# Patient Record
Sex: Female | Born: 1960 | Race: White | Hispanic: No | Marital: Married | Smoking: Never smoker
Health system: Southern US, Community
[De-identification: ages and names within clinical notes are randomized; demographics above are authoritative.]

## PROBLEM LIST (undated history)

## (undated) DIAGNOSIS — M810 Age-related osteoporosis without current pathological fracture: Secondary | ICD-10-CM

## (undated) HISTORY — DX: Age-related osteoporosis without current pathological fracture: M81.0

## (undated) HISTORY — PX: TONSILLECTOMY AND ADENOIDECTOMY: SHX28

## (undated) HISTORY — PX: BREAST BIOPSY: SHX20

## (undated) HISTORY — PX: TONSILLECTOMY: SUR1361

---

## 2005-06-08 ENCOUNTER — Ambulatory Visit: Payer: Self-pay | Admitting: Obstetrics and Gynecology

## 2005-06-21 ENCOUNTER — Ambulatory Visit: Payer: Self-pay | Admitting: Obstetrics and Gynecology

## 2007-02-02 IMAGING — US ULTRASOUND LEFT BREAST
1 series · 17 of 24 positions shown · non-contrast
Comparison: none

REASON FOR EXAM: Nodularity
                       U/S PRN
COMMENTS:

[Series 1: ultrasound left breast · 17 of 24 slices shown]
[im 1/24]
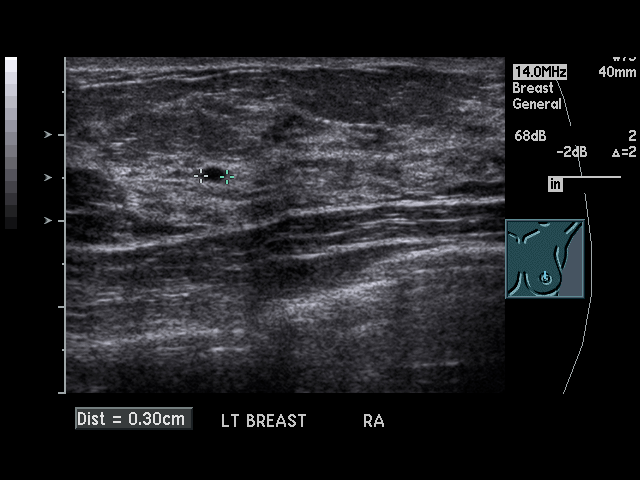
[im 3/24]
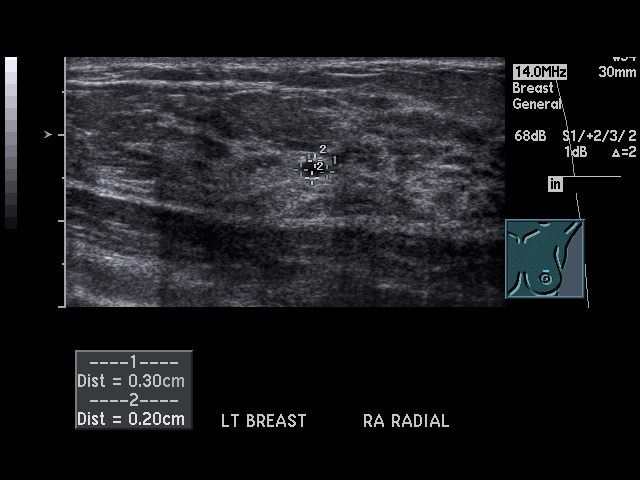
[im 4/24]
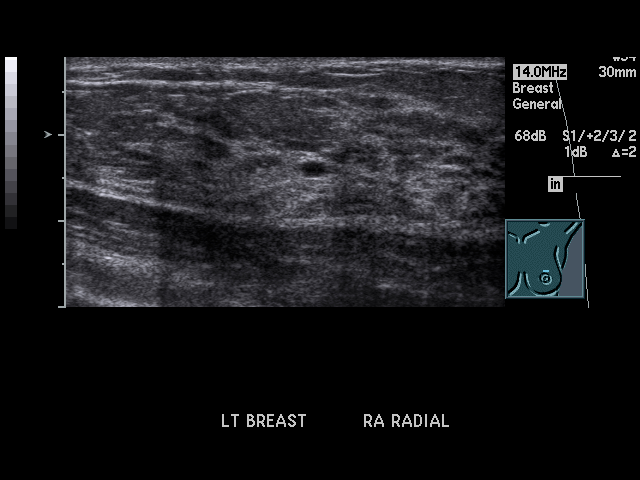
[im 5/24]
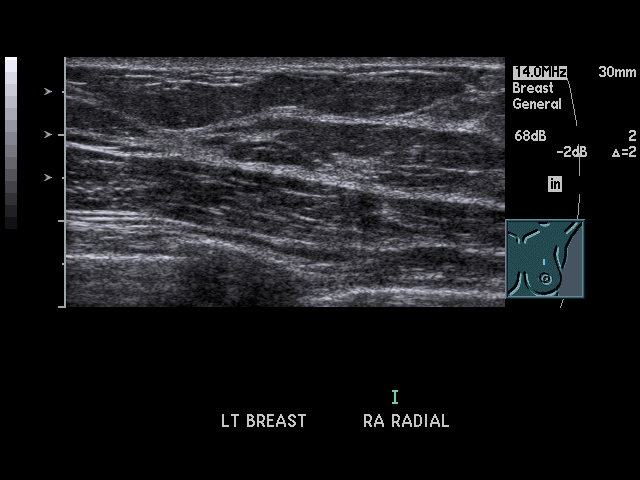
[im 7/24]
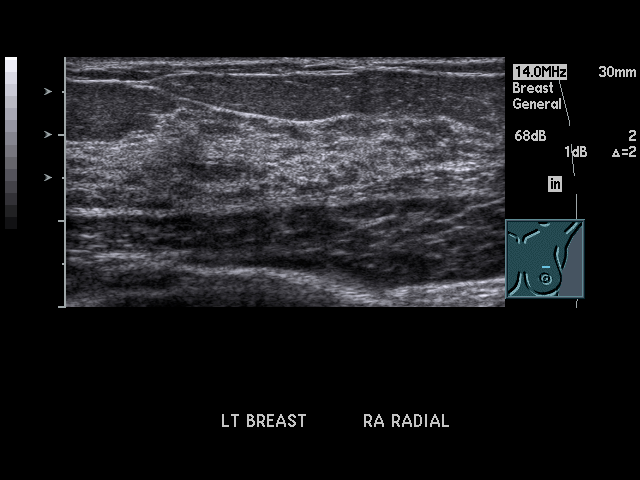
[im 8/24]
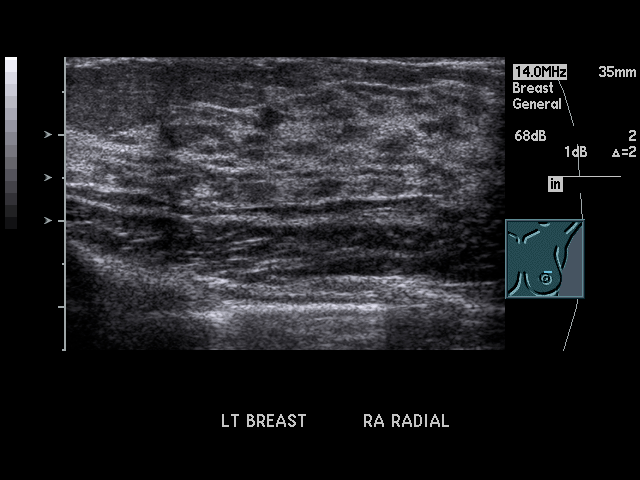
[im 10/24]
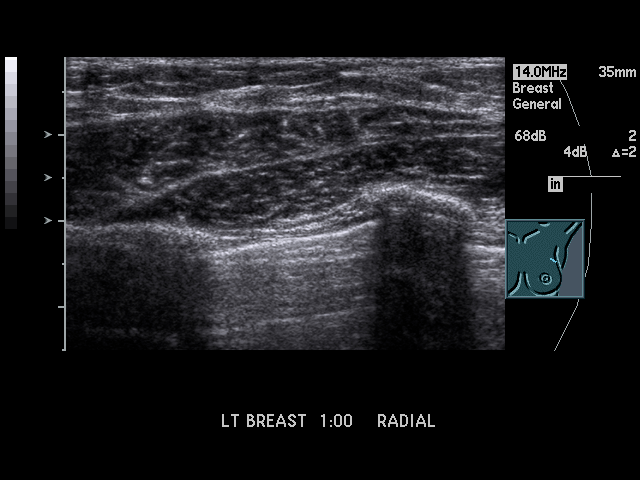
[im 11/24]
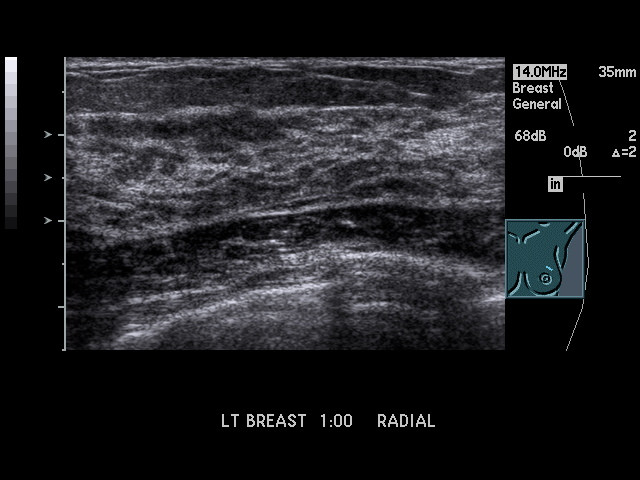
[im 13/24]
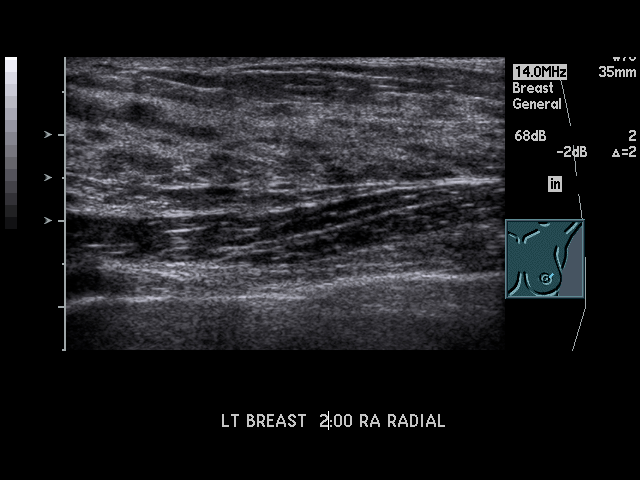
[im 14/24]
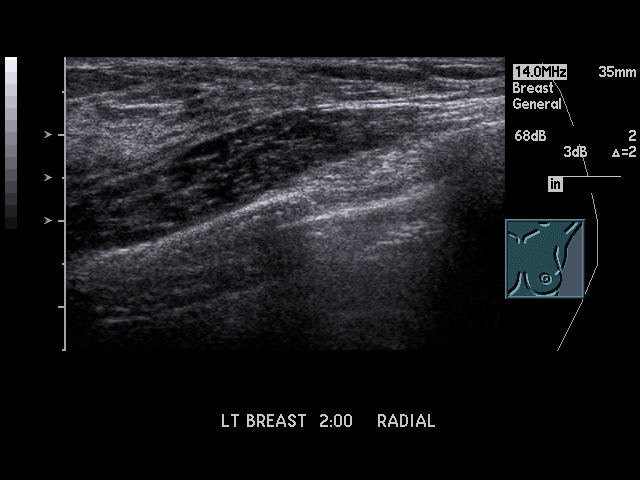
[im 15/24]
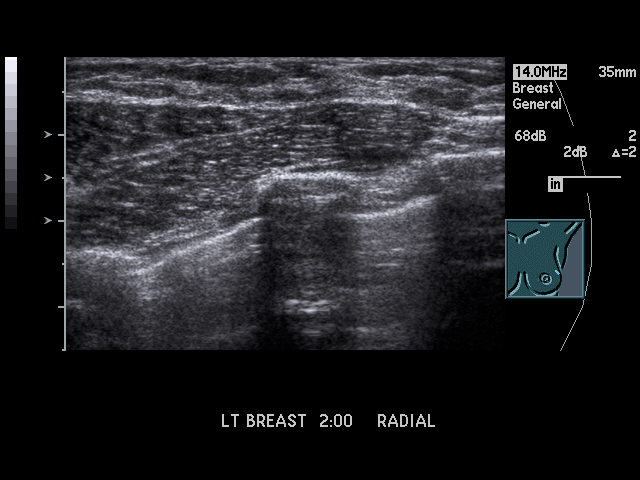
[im 17/24]
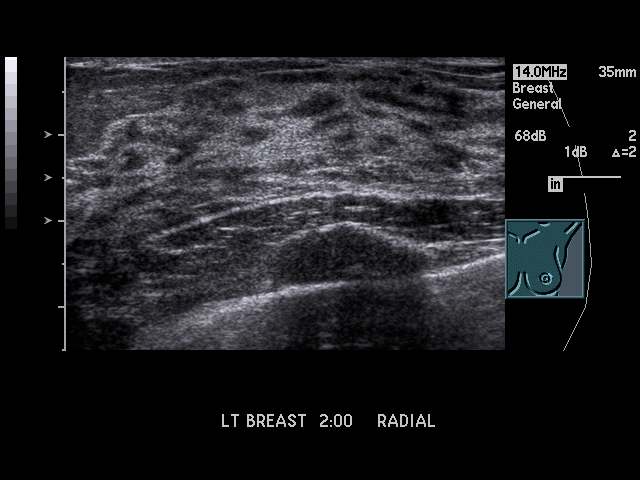
[im 18/24]
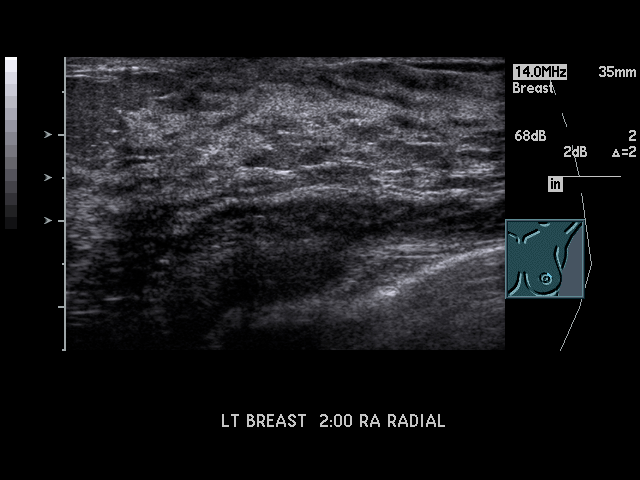
[im 20/24]
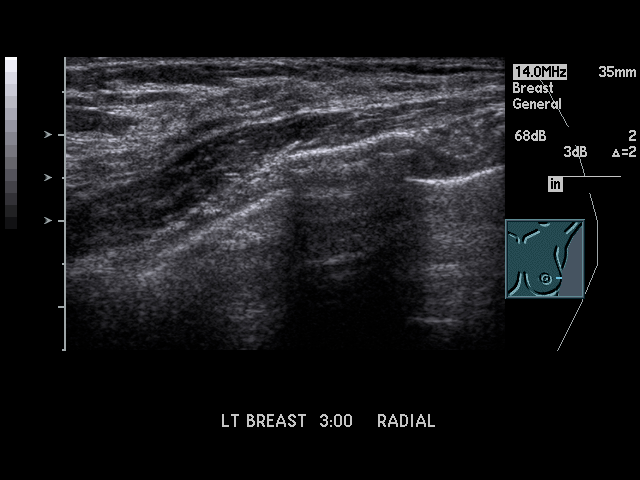
[im 21/24]
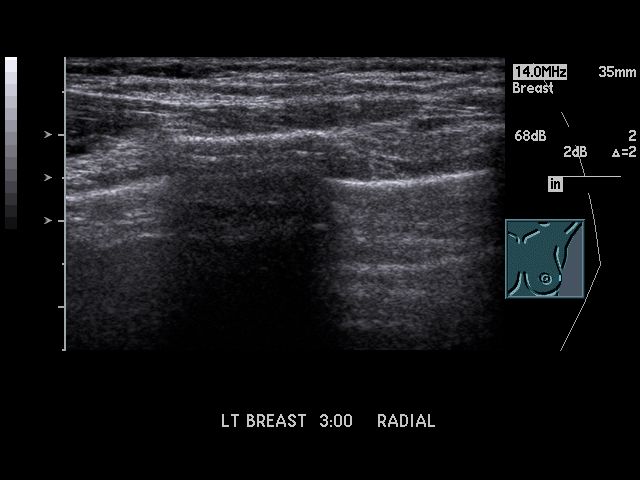
[im 22/24]
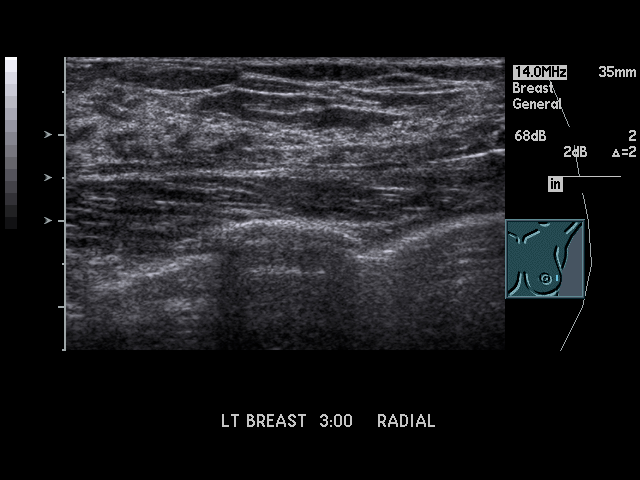
[im 24/24]
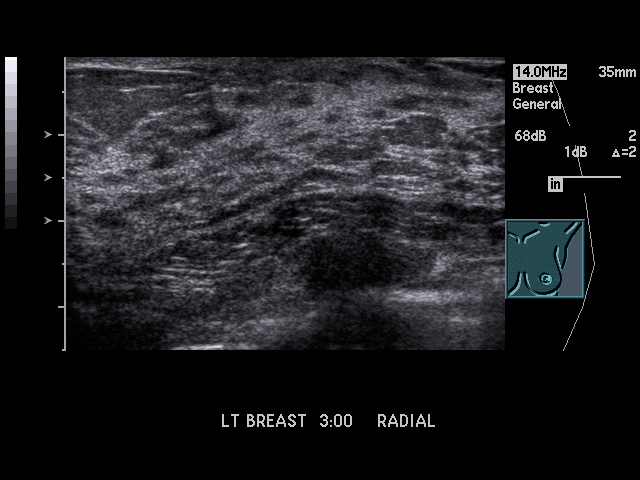

[17 of 24 positions shown; findings below may reference images not displayed]

PROCEDURE:     US  - US BREAST LEFT  - June 21, 2005  [DATE]

RESULT:     Ultrasound is performed of the upper outer periareolar region on
the LEFT.

Ultrasound today did not reveal a discrete cystic or solid mass in the area
of interest. A tiny, 2.0 to 3.0 mm in diameter, anechoic structure likely
reflecting a cyst was seen at the 12 o'clock position.
IMPRESSION: I do not see findings to suggest malignancy. Please see the
dictation for the supplemental mammographic views of this same day for final
recommendations and BI-RADS classification.

## 2012-03-22 ENCOUNTER — Ambulatory Visit (INDEPENDENT_AMBULATORY_CARE_PROVIDER_SITE_OTHER): Payer: BC Managed Care – PPO | Admitting: Internal Medicine

## 2012-03-22 ENCOUNTER — Encounter: Payer: Self-pay | Admitting: Internal Medicine

## 2012-03-22 ENCOUNTER — Other Ambulatory Visit (HOSPITAL_COMMUNITY)
Admission: RE | Admit: 2012-03-22 | Discharge: 2012-03-22 | Disposition: A | Discharge status: Home / Self Care | Payer: BC Managed Care – PPO | Source: Ambulatory Visit | Attending: Urology | Admitting: Urology

## 2012-03-22 VITALS — BP 140/70 | HR 86 | Temp 98.0°F | Ht 64.0 in | Wt 116.8 lb

## 2012-03-22 DIAGNOSIS — R5381 Other malaise: Secondary | ICD-10-CM

## 2012-03-22 DIAGNOSIS — Z01419 Encounter for gynecological examination (general) (routine) without abnormal findings: Secondary | ICD-10-CM | POA: Insufficient documentation

## 2012-03-22 DIAGNOSIS — Z139 Encounter for screening, unspecified: Secondary | ICD-10-CM

## 2012-03-22 DIAGNOSIS — R5383 Other fatigue: Secondary | ICD-10-CM

## 2012-03-22 MED ORDER — IBUPROFEN 800 MG PO TABS: 800.0000 mg | ORAL_TABLET | Freq: Two times a day (BID) | ORAL | Status: DC | PRN

## 2012-03-22 MED ORDER — VALACYCLOVIR HCL 500 MG PO TABS: 500.0000 mg | ORAL_TABLET | Freq: Every day | ORAL | Status: DC

## 2012-03-22 NOTE — Patient Instructions (Signed)
It was nice meeting you today.  We are going to get you scheduled for labs and your yearly mammogram.  Let me know if you have any problems.

## 2012-03-24 ENCOUNTER — Encounter: Payer: Self-pay | Admitting: Internal Medicine

## 2012-03-24 NOTE — Progress Notes (Signed)
  Subjective:    Patient ID: Whitney Guerrero, female    DOB: [birth year], 51 y.o.   MRN: [id number]  HPI 51 year old female who comes in today to establish care and for her complete physical exam.  Previous patient of Dr Lin Givens.  Overall doing well.  Some increased stress.  Son is in [place].  She has custody over her four year old granddaughter.  Works on a farm.  Has had some recent problems with low back discomfort.  Present now for a few weeks.  Does a lot of heavy lifting, etc.  Is getting better.  Takes an occasional Ibuprofen.  No radiation of the pain.  No numbness or tingling.  Pain localized to the lower back.  Changing and now wearing running shoes has helped.  Stays active.  No chest pain or tightness with increased activity or exertion.  Feels she is handling stress relatively well.   History reviewed. No pertinent past medical history.   Review of Systems Patient denies any headache, lightheadedness or dizziness.  No significant sinus or allergy symptoms.  No chest pain, tightness or palpitations.  No increased shortness of breath, cough or congestion.  No acid reflux, dysphagia or odynophagia.  No nausea or vomiting.  No abdominal pain or cramping.  No bowel change, such as diarrhea, constipation, BRBPR or melana.  No urine change.  No period for six months.        Objective:   Physical Exam Filed Vitals:   03/22/12 0945  BP: 140/70  Pulse: 86  Temp: 98 F (36.7 C)   Blood pressure recheck:  37/6  51 year old female in no acute distress.   HEENT:  Nares- clear.  Oropharynx - without lesions. NECK:  Supple.  Nontender.  No audible bruit.  HEART:  Appears to be regular. LUNGS:  No crackles or wheezing audible.  Respirations even and unlabored.  RADIAL PULSE:  Equal bilaterally.    BREASTS:  No nipple discharge or nipple retraction present.  Could not appreciate any distinct nodules or axillary adenopathy.  ABDOMEN:  Soft, nontender.  Bowel sounds present and normal.  No  audible abdominal bruit.  GU:  Normal external genitalia.  Vaginal vault without lesions.  Cervix identified.  Pap performed. Could not appreciate any adnexal masses or tenderness.   RECTAL:  Heme negative.   EXTREMITIES:  No increased edema present.  DP pulses palpable and equal bilaterally. MSK:  No pain to palpation.  Negative straight leg raise.  Motor strength equal bilaterally.  Able to sit, stand and walk without difficulty.          Assessment & Plan:  REOCCURRING FEVER BLISTERS.  Takes Valtrex regularly.  Rx refilled.   BACK PAIN.  Exam as outlined.  Improving with changing shoes.  Discussed other conservative measures.  No straining with lifting, etc.  Hold on xray.  Follow.  If persistent problems - will require reevaluation.    FATIGUE.  Did report some fatigue.  Overall feels she is doing well.  Will check cbc, met c and tsh.  HEALTH MAINTENANCE.  Physical today.  Schedule her mammogram.  Pap with HPV today.  Check fasting lipid profile.  Discussed the need for a colonscopy.  She will call me when agreeable for colonoscopy.  Hemoccult cards.

## 2012-03-29 ENCOUNTER — Other Ambulatory Visit (INDEPENDENT_AMBULATORY_CARE_PROVIDER_SITE_OTHER): Payer: BC Managed Care – PPO

## 2012-03-29 DIAGNOSIS — Z139 Encounter for screening, unspecified: Secondary | ICD-10-CM

## 2012-03-29 DIAGNOSIS — R5383 Other fatigue: Secondary | ICD-10-CM

## 2012-03-29 LAB — CBC WITH DIFFERENTIAL/PLATELET
Basophils Absolute: 0 10*3/uL (ref 0.0–0.1)
Basophils Relative: 0.4 % (ref 0.0–3.0)
Eosinophils Absolute: 0.1 10*3/uL (ref 0.0–0.7)
Lymphocytes Relative: 32 % (ref 12.0–46.0)
MCHC: 33.3 g/dL (ref 30.0–36.0)
Neutrophils Relative %: 53.6 % (ref 43.0–77.0)
RBC: 4.22 Mil/uL (ref 3.87–5.11)
RDW: 12.9 % (ref 11.5–14.6)

## 2012-03-29 LAB — LIPID PANEL: Total CHOL/HDL Ratio: 3

## 2012-03-29 LAB — COMPREHENSIVE METABOLIC PANEL
ALT: 20 U/L (ref 0–35)
AST: 25 U/L (ref 0–37)
Albumin: 3.9 g/dL (ref 3.5–5.2)
Calcium: 9.3 mg/dL (ref 8.4–10.5)
Chloride: 103 mEq/L (ref 96–112)
Potassium: 4 mEq/L (ref 3.5–5.1)
Total Protein: 7.1 g/dL (ref 6.0–8.3)

## 2012-03-29 LAB — LDL CHOLESTEROL, DIRECT: Direct LDL: 117.6 mg/dL

## 2012-03-30 ENCOUNTER — Other Ambulatory Visit: Payer: Self-pay | Admitting: Internal Medicine

## 2012-03-30 DIAGNOSIS — D72819 Decreased white blood cell count, unspecified: Secondary | ICD-10-CM

## 2012-04-02 ENCOUNTER — Telehealth: Payer: Self-pay | Admitting: Internal Medicine

## 2012-04-02 ENCOUNTER — Encounter: Payer: Self-pay | Admitting: *Deleted

## 2012-04-02 NOTE — Telephone Encounter (Signed)
Please notify pt mammogram (03/29/12) - ok.

## 2012-04-02 NOTE — Progress Notes (Signed)
Result letter mailed out to patient home address

## 2012-04-05 ENCOUNTER — Encounter: Payer: Self-pay | Admitting: Internal Medicine

## 2012-04-10 NOTE — Telephone Encounter (Signed)
Called patient no answer.

## 2012-05-03 ENCOUNTER — Other Ambulatory Visit: Payer: Self-pay | Admitting: Internal Medicine

## 2012-05-03 ENCOUNTER — Other Ambulatory Visit (INDEPENDENT_AMBULATORY_CARE_PROVIDER_SITE_OTHER): Payer: BC Managed Care – PPO

## 2012-05-03 DIAGNOSIS — D72819 Decreased white blood cell count, unspecified: Secondary | ICD-10-CM

## 2012-05-03 LAB — CBC WITH DIFFERENTIAL/PLATELET
Basophils Absolute: 0 10*3/uL (ref 0.0–0.1)
Basophils Relative: 0.3 % (ref 0.0–3.0)
Hemoglobin: 13.5 g/dL (ref 12.0–15.0)
Lymphocytes Relative: 30.2 % (ref 12.0–46.0)
Monocytes Relative: 10.9 % (ref 3.0–12.0)
Neutro Abs: 2.4 10*3/uL (ref 1.4–7.7)
RBC: 4.29 Mil/uL (ref 3.87–5.11)
RDW: 12.7 % (ref 11.5–14.6)

## 2012-05-09 ENCOUNTER — Telehealth: Payer: Self-pay | Admitting: *Deleted

## 2012-05-09 NOTE — Telephone Encounter (Signed)
Message copied by Marlene Lard on Thu May 09, 2012  5:46 PM ------      Message from: Charm Barges      Created: Fri May 03, 2012  1:14 PM       Cbc ok.  White blood cell count improved.  Will follow.  Would like to recheck cbc in 5/14 just to confirm stable.   I will order - please schedule appt.

## 2012-05-09 NOTE — Telephone Encounter (Signed)
Left message for patient to return call.

## 2012-05-09 NOTE — Telephone Encounter (Signed)
Message copied by Marlene Lard on Thu May 09, 2012  4:52 PM ------      Message from: Charm Barges      Created: Fri May 03, 2012  1:14 PM       Cbc ok.  White blood cell count improved.  Will follow.  Would like to recheck cbc in 5/14 just to confirm stable.   I will order - please schedule appt.

## 2012-05-17 NOTE — Telephone Encounter (Signed)
Sent patient a letter about her next lab appointment.

## 2012-08-21 ENCOUNTER — Other Ambulatory Visit: Payer: Self-pay | Admitting: *Deleted

## 2012-08-27 MED ORDER — IBUPROFEN 800 MG PO TABS: 800.0000 mg | ORAL_TABLET | Freq: Two times a day (BID) | ORAL | Status: DC | PRN

## 2012-09-16 ENCOUNTER — Telehealth: Payer: Self-pay | Admitting: Internal Medicine

## 2012-09-16 DIAGNOSIS — Z1211 Encounter for screening for malignant neoplasm of colon: Secondary | ICD-10-CM

## 2012-09-16 NOTE — Telephone Encounter (Signed)
Patient needing a referral to GI for her 1st Colonoscopy.

## 2012-09-16 NOTE — Telephone Encounter (Signed)
Left message on patient voicemail letting her know that the order has been placed for her referral for colonoscopy, and that Amber should be calling her with an appt.

## 2012-09-16 NOTE — Telephone Encounter (Signed)
Please Advise..... Patient wants a referral to GI for her 1st Colonoscopy

## 2012-09-16 NOTE — Telephone Encounter (Signed)
Order placed for referral for GI - colonoscopy.  Whitney Guerrero should be calling her with an appt.

## 2012-10-08 ENCOUNTER — Other Ambulatory Visit: Payer: BC Managed Care – PPO

## 2012-10-11 ENCOUNTER — Other Ambulatory Visit (INDEPENDENT_AMBULATORY_CARE_PROVIDER_SITE_OTHER): Payer: BC Managed Care – PPO

## 2012-10-11 DIAGNOSIS — D72819 Decreased white blood cell count, unspecified: Secondary | ICD-10-CM

## 2012-10-11 LAB — CBC WITH DIFFERENTIAL/PLATELET
Basophils Absolute: 0 10*3/uL (ref 0.0–0.1)
HCT: 39.6 % (ref 36.0–46.0)
Hemoglobin: 13.4 g/dL (ref 12.0–15.0)
Lymphs Abs: 1.5 10*3/uL (ref 0.7–4.0)
MCV: 93.6 fl (ref 78.0–100.0)
Monocytes Absolute: 0.6 10*3/uL (ref 0.1–1.0)
Neutro Abs: 2.5 10*3/uL (ref 1.4–7.7)
Platelets: 370 10*3/uL (ref 150.0–400.0)
RDW: 12.2 % (ref 11.5–14.6)

## 2013-01-29 ENCOUNTER — Telehealth: Payer: Self-pay | Admitting: Internal Medicine

## 2013-01-29 NOTE — Telephone Encounter (Signed)
Patient has a sinus infection and wants something called into the pharmacy

## 2013-01-29 NOTE — Telephone Encounter (Signed)
Scheduled appt with Whitney Guerrero tomorrow

## 2013-01-30 ENCOUNTER — Ambulatory Visit: Payer: BC Managed Care – PPO | Admitting: Adult Health

## 2013-03-03 ENCOUNTER — Other Ambulatory Visit: Payer: Self-pay | Admitting: *Deleted

## 2013-03-03 MED ORDER — VALACYCLOVIR HCL 500 MG PO TABS: 500.0000 mg | ORAL_TABLET | Freq: Every day | ORAL | Status: DC

## 2013-03-24 ENCOUNTER — Ambulatory Visit (INDEPENDENT_AMBULATORY_CARE_PROVIDER_SITE_OTHER): Payer: BC Managed Care – PPO | Admitting: Internal Medicine

## 2013-03-24 ENCOUNTER — Encounter: Payer: Self-pay | Admitting: Internal Medicine

## 2013-03-24 ENCOUNTER — Other Ambulatory Visit (HOSPITAL_COMMUNITY)
Admission: RE | Admit: 2013-03-24 | Discharge: 2013-03-24 | Disposition: A | Discharge status: Home / Self Care | Payer: BC Managed Care – PPO | Source: Ambulatory Visit | Attending: Internal Medicine | Admitting: Internal Medicine

## 2013-03-24 ENCOUNTER — Encounter: Payer: BC Managed Care – PPO | Admitting: Internal Medicine

## 2013-03-24 ENCOUNTER — Encounter (INDEPENDENT_AMBULATORY_CARE_PROVIDER_SITE_OTHER): Payer: Self-pay

## 2013-03-24 VITALS — BP 110/80 | HR 85 | Temp 98.2°F | Ht 64.0 in | Wt 115.5 lb

## 2013-03-24 DIAGNOSIS — Z13220 Encounter for screening for lipoid disorders: Secondary | ICD-10-CM

## 2013-03-24 DIAGNOSIS — Z23 Encounter for immunization: Secondary | ICD-10-CM

## 2013-03-24 DIAGNOSIS — Z01419 Encounter for gynecological examination (general) (routine) without abnormal findings: Secondary | ICD-10-CM | POA: Insufficient documentation

## 2013-03-24 DIAGNOSIS — Z124 Encounter for screening for malignant neoplasm of cervix: Secondary | ICD-10-CM

## 2013-03-24 DIAGNOSIS — R5381 Other malaise: Secondary | ICD-10-CM

## 2013-03-24 DIAGNOSIS — R5383 Other fatigue: Secondary | ICD-10-CM

## 2013-03-24 DIAGNOSIS — Z1151 Encounter for screening for human papillomavirus (HPV): Secondary | ICD-10-CM | POA: Insufficient documentation

## 2013-03-26 ENCOUNTER — Encounter: Payer: Self-pay | Admitting: Internal Medicine

## 2013-03-26 ENCOUNTER — Encounter: Payer: Self-pay | Admitting: *Deleted

## 2013-03-26 NOTE — Progress Notes (Signed)
  Subjective:    Patient ID: Whitney Guerrero, female    DOB: 10-24-1960, 52 y.o.   MRN: [id number]  HPI 52 year old female who comes in today for her complete physical exam.  Overall doing well.  Some increased stress.  She has custody over her 88 year old granddaughter.  Works on a farm.  Has had some problems with low back discomfort.  This has been better recently.  Not a significant issue for her now. Stays active.  No chest pain or tightness with increased activity or exertion.  Feels she is handling stress relatively well.  No period in one year.  Eating and drinking well.  Bowels stable.     Review of Systems Patient denies any headache, lightheadedness or dizziness.  No significant sinus or allergy symptoms.  No chest pain, tightness or palpitations.  No increased shortness of breath, cough or congestion.  No acid reflux, dysphagia or odynophagia.  No nausea or vomiting.  No abdominal pain or cramping.  No bowel change, such as diarrhea, constipation, BRBPR or melana.  No urine change.  No period for one year.  Back better.  Handling stress relatively well.     Objective:   Physical Exam  Filed Vitals:   03/24/13 1048  BP: 110/80  Pulse: 85  Temp: 98.2 F (46.59 C)   52 year old female in no acute distress.   HEENT:  Nares- clear.  Oropharynx - without lesions. NECK:  Supple.  Nontender.  No audible bruit.  HEART:  Appears to be regular. LUNGS:  No crackles or wheezing audible.  Respirations even and unlabored.  RADIAL PULSE:  Equal bilaterally.    BREASTS:  No nipple discharge or nipple retraction present.  Could not appreciate any distinct nodules or axillary adenopathy.  ABDOMEN:  Soft, nontender.  Bowel sounds present and normal.  No audible abdominal bruit.  GU:  Normal external genitalia.  Vaginal vault without lesions.  Cervix identified.  Pap performed. Could not appreciate any adnexal masses or tenderness.   RECTAL:  Heme negative.   EXTREMITIES:  No increased edema  present.  DP pulses palpable and equal bilaterally.          Assessment & Plan:  REOCCURRING FEVER BLISTERS.  Takes Valtrex regularly.   BACK PAIN.  Not an issue for her now.  Follow.   FATIGUE.  Did report some fatigue.  Overall feels she is doing well.  Will check met c and tsh.  Recent cbc (5/14) within normal limits.    HEALTH MAINTENANCE.  Physical today.  Schedule her mammogram.  Pap with HPV today.  Check fasting lipid profile.  Just had colonoscopy.  States everything checked out fine and they recommended f/u colonoscopy in 10 years.    I spent 25 minutes with the patient and more than 50% of the time was spent in consultation regarding the above.

## 2013-04-04 ENCOUNTER — Other Ambulatory Visit (INDEPENDENT_AMBULATORY_CARE_PROVIDER_SITE_OTHER): Payer: BC Managed Care – PPO

## 2013-04-04 DIAGNOSIS — R5381 Other malaise: Secondary | ICD-10-CM

## 2013-04-04 DIAGNOSIS — R5383 Other fatigue: Secondary | ICD-10-CM

## 2013-04-04 DIAGNOSIS — Z13220 Encounter for screening for lipoid disorders: Secondary | ICD-10-CM

## 2013-04-04 LAB — COMPREHENSIVE METABOLIC PANEL
ALT: 21 U/L (ref 0–35)
Alkaline Phosphatase: 79 U/L (ref 39–117)
CO2: 33 mEq/L — ABNORMAL HIGH (ref 19–32)
Creatinine, Ser: 0.8 mg/dL (ref 0.4–1.2)
GFR: 75.72 mL/min (ref >60.00)
Glucose, Bld: 83 mg/dL (ref 70–99)
Sodium: 137 mEq/L (ref 135–145)
Total Bilirubin: 0.6 mg/dL (ref 0.3–1.2)
Total Protein: 6.7 g/dL (ref 6.0–8.3)

## 2013-04-04 LAB — LIPID PANEL
HDL: 69 mg/dL (ref >39.00)
Triglycerides: 69 mg/dL (ref 0.0–149.0)
VLDL: 13.8 mg/dL (ref 0.0–40.0)

## 2013-04-04 LAB — LDL CHOLESTEROL, DIRECT: Direct LDL: 129.2 mg/dL

## 2013-04-04 LAB — TSH: TSH: 2.73 u[IU]/mL (ref 0.35–5.50)

## 2013-04-07 ENCOUNTER — Encounter: Payer: Self-pay | Admitting: *Deleted

## 2013-04-16 ENCOUNTER — Encounter: Payer: Self-pay | Admitting: Internal Medicine

## 2013-09-18 ENCOUNTER — Telehealth: Payer: Self-pay | Admitting: *Deleted

## 2013-09-18 MED ORDER — IBUPROFEN 800 MG PO TABS: 800.0000 mg | ORAL_TABLET | Freq: Two times a day (BID) | ORAL | Status: DC | PRN

## 2013-09-18 MED ORDER — VALACYCLOVIR HCL 500 MG PO TABS: 500.0000 mg | ORAL_TABLET | Freq: Every day | ORAL | Status: DC

## 2013-09-18 NOTE — Telephone Encounter (Signed)
Pt called stating that her refills were never sent in at her last visit. Refills sent while pt was on the phone today

## 2013-11-25 ENCOUNTER — Ambulatory Visit (INDEPENDENT_AMBULATORY_CARE_PROVIDER_SITE_OTHER): Payer: BC Managed Care – PPO | Admitting: Adult Health

## 2013-11-25 ENCOUNTER — Encounter: Payer: Self-pay | Admitting: Adult Health

## 2013-11-25 VITALS — BP 102/64 | HR 84 | Temp 98.0°F | Resp 14 | Ht 64.0 in | Wt 113.5 lb

## 2013-11-25 DIAGNOSIS — R3129 Other microscopic hematuria: Secondary | ICD-10-CM | POA: Insufficient documentation

## 2013-11-25 DIAGNOSIS — N390 Urinary tract infection, site not specified: Secondary | ICD-10-CM

## 2013-11-25 DIAGNOSIS — F439 Reaction to severe stress, unspecified: Secondary | ICD-10-CM | POA: Insufficient documentation

## 2013-11-25 LAB — POCT URINALYSIS DIPSTICK
Bilirubin, UA: NEGATIVE
Glucose, UA: NEGATIVE
Ketones, UA: NEGATIVE
Nitrite, UA: NEGATIVE
Protein, UA: 30
Spec Grav, UA: 1.015
UROBILINOGEN UA: 0.2
pH, UA: 8.5

## 2013-11-25 LAB — URINALYSIS, ROUTINE W REFLEX MICROSCOPIC
Ketones, ur: NEGATIVE
Nitrite: NEGATIVE
Specific Gravity, Urine: 1.015 (ref 1.000–1.030)
URINE GLUCOSE: NEGATIVE
Urobilinogen, UA: 0.2 (ref 0.0–1.0)
pH: 8.5 — AB (ref 5.0–8.0)

## 2013-11-25 MED ORDER — CIPROFLOXACIN HCL 250 MG PO TABS: 250.0000 mg | ORAL_TABLET | Freq: Two times a day (BID) | ORAL | Status: DC

## 2013-11-25 NOTE — Progress Notes (Signed)
Pre visit review using our clinic review tool, if applicable. No additional management support is needed unless otherwise documented below in the visit note. 

## 2013-11-25 NOTE — Progress Notes (Signed)
   Subjective:    Patient ID: Whitney Guerrero, female    DOB: 1960-09-23, 53 y.o.   MRN: [id number]  Urinary Tract Infection  Associated symptoms include frequency.    Pleasant 53 yo female presents today for symptoms of a UTI. Started last night around [phone]. Describes dysuria and frequent urination that has kept her up. Usually drinks water and urinates a lot, however last night was excessive. Denies costoverterbral pain, states she did feel warm this morning, but did not have a fever.   No past medical history on file.  Current Outpatient Prescriptions on File Prior to Visit  Medication Sig Dispense Refill  . Calcium Carbonate-Vit D-Min 1200-1000 MG-UNIT CHEW Chew 1,200 mg by mouth daily.      Marland Kitchen. ibuprofen (ADVIL,MOTRIN) 800 MG tablet Take 1 tablet (800 mg total) by mouth 2 (two) times daily as needed.  30 tablet  1  . Omega-3 Fatty Acids (FISH OIL) 1000 MG CAPS Take 1,000 mg by mouth daily.      . valACYclovir (VALTREX) 500 MG tablet Take 1 tablet (500 mg total) by mouth daily.  30 tablet  6   No current facility-administered medications on file prior to visit.     Review of Systems  Constitutional: Negative.   Genitourinary: Positive for dysuria and frequency.  All other systems reviewed and are negative.      Objective:  BP 102/64  Pulse 84  Temp(Src) 98 F (36.7 C) (Oral)  Resp 14  Wt 113 lb 8 oz (51.483 kg)  SpO2 97%   Physical Exam  Constitutional: She is oriented to person, place, and time. She appears well-developed and well-nourished. No distress.  Cardiovascular: Normal rate and regular rhythm.   Pulmonary/Chest: Effort normal. No respiratory distress.  Musculoskeletal: Normal range of motion.  Neurological: She is alert and oriented to person, place, and time.  Skin: Skin is warm and dry.  Psychiatric: She has a normal mood and affect. Her behavior is normal. Judgment and thought content normal.      Assessment & Plan:   1. Urinary tract infection, site  not specified UA consistent with UTI. Will send for culture and urinalysis. Start cipro.  - POCT urinalysis dipstick - Urine culture - ciprofloxacin (CIPRO) 250 MG tablet; Take 1 tablet (250 mg total) by mouth 2 (two) times daily.  Dispense: 6 tablet; Refill: 0 - Urinalysis, Routine w reflex microscopic  2. Microscopic hematuria Return 3 days after completing antibiotic to verify that blood has cleared. - Urinalysis, Routine w reflex microscopic; Future

## 2013-11-25 NOTE — Patient Instructions (Signed)
  Start Cipro 250 mg twice a day for 3 days.  Drink plenty of water.  Try AZO or Urostat which are sold over-the-counter and used for a urinary tract infection discomfort. Take as directed on the package.  Please return for followup urinalysis 3 days after completing your antibiotics to verify that blood has cleared.  Call with any questions or concerns. Give may also take Tylenol for general discomfort as needed.

## 2013-11-27 LAB — URINE CULTURE

## 2013-12-01 ENCOUNTER — Other Ambulatory Visit: Payer: BC Managed Care – PPO

## 2013-12-05 ENCOUNTER — Other Ambulatory Visit: Payer: Self-pay | Admitting: Adult Health

## 2013-12-05 ENCOUNTER — Other Ambulatory Visit (INDEPENDENT_AMBULATORY_CARE_PROVIDER_SITE_OTHER): Payer: BC Managed Care – PPO

## 2013-12-05 DIAGNOSIS — R3129 Other microscopic hematuria: Secondary | ICD-10-CM

## 2013-12-05 LAB — URINALYSIS, ROUTINE W REFLEX MICROSCOPIC
Bilirubin Urine: NEGATIVE
HGB URINE DIPSTICK: NEGATIVE
Ketones, ur: NEGATIVE
NITRITE: NEGATIVE
SPECIFIC GRAVITY, URINE: 1.015 (ref 1.000–1.030)
Urine Glucose: NEGATIVE
Urobilinogen, UA: 0.2 (ref 0.0–1.0)
pH: 6.5 (ref 5.0–8.0)

## 2013-12-05 MED ORDER — LEVOFLOXACIN 500 MG PO TABS: 500.0000 mg | ORAL_TABLET | Freq: Every day | ORAL | Status: DC

## 2013-12-05 NOTE — Progress Notes (Signed)
Urinalysis shows large amount of white cells and bacteria. The UTI has not cleared. Also the blood did clear. I am sending in a prescription for levaquin 1 daily for 5 days.

## 2013-12-11 NOTE — Progress Notes (Signed)
Called and advised patient of results,  verbalized understanding. 

## 2014-02-16 ENCOUNTER — Ambulatory Visit (INDEPENDENT_AMBULATORY_CARE_PROVIDER_SITE_OTHER): Payer: BC Managed Care – PPO

## 2014-02-16 ENCOUNTER — Encounter: Payer: Self-pay | Admitting: Podiatry

## 2014-02-16 ENCOUNTER — Ambulatory Visit (INDEPENDENT_AMBULATORY_CARE_PROVIDER_SITE_OTHER): Payer: BC Managed Care – PPO | Admitting: Podiatry

## 2014-02-16 VITALS — BP 123/78 | HR 81 | Resp 16 | Ht 62.0 in | Wt 113.0 lb

## 2014-02-16 DIAGNOSIS — M779 Enthesopathy, unspecified: Secondary | ICD-10-CM

## 2014-02-16 DIAGNOSIS — M79609 Pain in unspecified limb: Secondary | ICD-10-CM

## 2014-02-16 DIAGNOSIS — M778 Other enthesopathies, not elsewhere classified: Secondary | ICD-10-CM

## 2014-02-16 DIAGNOSIS — M7750 Other enthesopathy of unspecified foot: Secondary | ICD-10-CM

## 2014-02-16 NOTE — Progress Notes (Signed)
   Subjective:    Patient ID: Whitney Guerrero, female    DOB: [birth year], 53 y.o.   MRN: [id number]  HPI Comments: i have pain in my rt foot. It hurts under my toes. It makes me walk funny if i dont have a shoe on. Ive had the pain for a couple months. Its getting worse. It hurts to walk and stand. i havent done anything for my foot.  Foot Pain      Review of Systems  All other systems reviewed and are negative.      Objective:   Physical Exam: I have reviewed his history medications allergies assistant. Pulses are strongly palpable bilateral neurologic is intact per Semmes-Weinstein monofilament. Deep tendon reflexes are intact bilateral muscle strength is 5 over 5 dorsiflexors plantar flexors inverters everters all intrinsic musculature is intact. Orthopedic evaluation demonstrates pain on in range of motion and on palpation of the second metatarsophalangeal joint of the right foot. Cutaneous evaluation demonstrates no erythema edema cellulitis drainage or odor.       Assessment & Plan:  Assessment: Capsulitis second metatarsophalangeal joint of the right foot.  Plan: Intra-articular injection today of dexamethasone and local anesthetic to the point of maximal tenderness second metatarsophalangeal joint right foot after sterile Betadine skin prep. And dispensed a carbon graphite insole. Followup as needed.

## 2014-03-23 ENCOUNTER — Ambulatory Visit (INDEPENDENT_AMBULATORY_CARE_PROVIDER_SITE_OTHER): Payer: BC Managed Care – PPO | Admitting: Podiatry

## 2014-03-23 ENCOUNTER — Encounter: Payer: Self-pay | Admitting: Podiatry

## 2014-03-23 VITALS — BP 129/98 | HR 96 | Resp 16

## 2014-03-23 DIAGNOSIS — M778 Other enthesopathies, not elsewhere classified: Secondary | ICD-10-CM

## 2014-03-23 DIAGNOSIS — M7751 Other enthesopathy of right foot: Secondary | ICD-10-CM

## 2014-03-23 DIAGNOSIS — M779 Enthesopathy, unspecified: Principal | ICD-10-CM

## 2014-03-23 MED ORDER — METHYLPREDNISOLONE (PAK) 4 MG PO TABS: ORAL_TABLET | ORAL | Status: DC

## 2014-03-23 MED ORDER — MELOXICAM 15 MG PO TABS: 15.0000 mg | ORAL_TABLET | Freq: Every day | ORAL | Status: DC

## 2014-03-23 NOTE — Progress Notes (Signed)
She presents today complaining of pain to the second metatarsophalangeal joint of the right foot. She states they really never improved after her last visit.  Objective: Vital signs are stable she is alert and oriented 3. She has pain on range of motion of the second metatarsophalangeal joint of the right foot.  Assessment: Capsulitis second metatarsophalangeal joint of the right foot.  Plan: Discussed the possible need for surgical intervention area we did perform an aspiration with Kenalog injection today this was performed after local anesthetic was administered and a block about the second metatarsophalangeal joint. The skin was then prepped with Betadine we were able to aspirate just a little under 1 mL of fluid. I wrote a prescription for a Medrol Dosepak to be followed by meloxicam.

## 2014-03-27 ENCOUNTER — Encounter: Payer: BC Managed Care – PPO | Admitting: Internal Medicine

## 2014-04-06 ENCOUNTER — Encounter: Payer: Self-pay | Admitting: Internal Medicine

## 2014-04-06 ENCOUNTER — Ambulatory Visit (INDEPENDENT_AMBULATORY_CARE_PROVIDER_SITE_OTHER): Payer: BC Managed Care – PPO | Admitting: Internal Medicine

## 2014-04-06 ENCOUNTER — Ambulatory Visit (INDEPENDENT_AMBULATORY_CARE_PROVIDER_SITE_OTHER): Payer: BC Managed Care – PPO | Admitting: *Deleted

## 2014-04-06 VITALS — BP 120/80 | HR 80 | Temp 97.8°F | Ht 62.0 in | Wt 114.8 lb

## 2014-04-06 DIAGNOSIS — R5383 Other fatigue: Secondary | ICD-10-CM

## 2014-04-06 DIAGNOSIS — Z13220 Encounter for screening for lipoid disorders: Secondary | ICD-10-CM

## 2014-04-06 DIAGNOSIS — N898 Other specified noninflammatory disorders of vagina: Secondary | ICD-10-CM

## 2014-04-06 DIAGNOSIS — M79673 Pain in unspecified foot: Secondary | ICD-10-CM

## 2014-04-06 DIAGNOSIS — Z658 Other specified problems related to psychosocial circumstances: Secondary | ICD-10-CM

## 2014-04-06 DIAGNOSIS — F439 Reaction to severe stress, unspecified: Secondary | ICD-10-CM

## 2014-04-06 DIAGNOSIS — Z23 Encounter for immunization: Secondary | ICD-10-CM

## 2014-04-06 MED ORDER — ESTRADIOL 0.1 MG/GM VA CREA: TOPICAL_CREAM | VAGINAL | Status: DC

## 2014-04-06 NOTE — Progress Notes (Signed)
Subjective:    Patient ID: Whitney Guerrero, female    DOB: [birth year], 53 y.o.   MRN: [id number]  HPI 53 year old female who comes in today for her complete physical exam.  Overall doing well.  Some increased stress.  She has custody over her 66six year old granddaughter.  Increased stress with her daughter.  Her daughter has a drug addiction.  We discussed counseling.  She is contemplating seeing a Veterinary surgeoncounselor.   Has some trouble sleeping.  Stays active.  No chest pain or tightness with increased activity or exertion.   No period in two years.  Eating and drinking well.  Bowels stable.  Has had problems with her foot.  Some tendonitis.  Seeing Dr Al CorpusHyatt.  On an antiinflammatory now.  Is better.     Current Outpatient Prescriptions on File Prior to Visit  Medication Sig Dispense Refill  . Calcium Carbonate-Vit D-Min 1200-1000 MG-UNIT CHEW Chew 1,200 mg by mouth daily.    Marland Kitchen. ibuprofen (ADVIL,MOTRIN) 800 MG tablet Take 1 tablet (800 mg total) by mouth 2 (two) times daily as needed. 30 tablet 1  . meloxicam (MOBIC) 15 MG tablet Take 1 tablet (15 mg total) by mouth daily. 30 tablet 3  . Omega-3 Fatty Acids (FISH OIL) 1000 MG CAPS Take 1,000 mg by mouth daily.    . Probiotic Product (PROBIOTIC DAILY PO) Take 1 tablet by mouth daily.    . valACYclovir (VALTREX) 500 MG tablet Take 1 tablet (500 mg total) by mouth daily. 30 tablet 6   No current facility-administered medications on file prior to visit.    Review of Systems Patient denies any headache, lightheadedness or dizziness.  No sinus or allergy symptoms.  No chest pain, tightness or palpitations.  No increased shortness of breath, cough or congestion.  No acid reflux, dysphagia or odynophagia.  No nausea or vomiting.  No abdominal pain or cramping.  No bowel change, such as diarrhea, constipation, BRBPR or melana.  No urine change.  No period for two years.  No vaginal spotting.  No back pain.  Increased stress with her daughter's situation.  .  Handling  stress relatively well.  Gave names of counselors.  Does report some vaginal dryness.       Objective:   Physical Exam  Filed Vitals:   04/06/14 0822  BP: 120/80  Pulse: 80  Temp: 97.8 F (6536.556 C)   53 year old female in no acute distress.   HEENT:  Nares- clear.  Oropharynx - without lesions. NECK:  Supple.  Nontender.  No audible bruit.  HEART:  Appears to be regular. LUNGS:  No crackles or wheezing audible.  Respirations even and unlabored.  RADIAL PULSE:  Equal bilaterally.    BREASTS:  No nipple discharge or nipple retraction present.  Could not appreciate any distinct nodules or axillary adenopathy.  ABDOMEN:  Soft, nontender.  Bowel sounds present and normal.  No audible abdominal bruit.  GU:  Not performed.    EXTREMITIES:  No increased edema present.  DP pulses palpable and equal bilaterally.          Assessment & Plan:  REOCCURRING FEVER BLISTERS.  Takes Valtrex regularly.   BACK PAIN.  Not an issue for her now.  Follow.    Other fatigue Probably multifactorial.  Check cbc, metc and tsh.   Vaginal dryness Estrace cream as directed.  Discussed risks of estrogen therapy.  Follow.   Stress Increased stress as outlined.  Discussed at length with her  today.  Numbers to counselors given.  Follow.   Foot pain, unspecified laterality Seeing Dr Al CorpusHyatt.  Better.    HEALTH MAINTENANCE.  Physical today.  She will schedule her mammogram.  Last 04/14/13 - Birads I.  Pap with HPV negative 03/24/13.  Check fasting lipid profile.  Had colonoscopy last year.  States everything checked out fine and they recommended f/u colonoscopy in 10 years. Need results.   I spent 25 minutes with the patient and more than 50% of the time was spent in consultation regarding the above.

## 2014-04-06 NOTE — Progress Notes (Signed)
Pre visit review using our clinic review tool, if applicable. No additional management support is needed unless otherwise documented below in the visit note. 

## 2014-04-07 ENCOUNTER — Encounter: Payer: Self-pay | Admitting: Internal Medicine

## 2014-04-07 DIAGNOSIS — M79673 Pain in unspecified foot: Secondary | ICD-10-CM | POA: Insufficient documentation

## 2014-04-13 ENCOUNTER — Ambulatory Visit (INDEPENDENT_AMBULATORY_CARE_PROVIDER_SITE_OTHER): Payer: BC Managed Care – PPO | Admitting: Podiatry

## 2014-04-13 VITALS — BP 132/86 | HR 86 | Resp 16

## 2014-04-13 DIAGNOSIS — M778 Other enthesopathies, not elsewhere classified: Secondary | ICD-10-CM

## 2014-04-13 DIAGNOSIS — M7751 Other enthesopathy of right foot: Secondary | ICD-10-CM

## 2014-04-13 DIAGNOSIS — M779 Enthesopathy, unspecified: Principal | ICD-10-CM

## 2014-04-13 MED ORDER — MELOXICAM 15 MG PO TABS: 15.0000 mg | ORAL_TABLET | Freq: Every day | ORAL | Status: DC

## 2014-04-13 NOTE — Progress Notes (Signed)
She resists today for follow-up of capsulitis second metatarsophalangeal joint of the right foot. She states that is doing much better.  Objective: Pulses are stable she is alert and oriented 3. She has no pain on palpation second metatarsal phalangeal joint of the right foot. There is no erythema or edema or swelling. No pain on range of motion.  Assessment: Well-healing capsulitis second metatarsophalangeal joint right foot.  Plan: encouraged all conservative therapies. I will follow up with her

## 2014-04-17 LAB — HM MAMMOGRAPHY: HM Mammogram: NEGATIVE

## 2014-04-20 ENCOUNTER — Other Ambulatory Visit (INDEPENDENT_AMBULATORY_CARE_PROVIDER_SITE_OTHER): Payer: BC Managed Care – PPO

## 2014-04-20 ENCOUNTER — Other Ambulatory Visit: Payer: Self-pay | Admitting: Internal Medicine

## 2014-04-20 DIAGNOSIS — D72819 Decreased white blood cell count, unspecified: Secondary | ICD-10-CM

## 2014-04-20 DIAGNOSIS — R5383 Other fatigue: Secondary | ICD-10-CM

## 2014-04-20 DIAGNOSIS — Z13220 Encounter for screening for lipoid disorders: Secondary | ICD-10-CM

## 2014-04-20 LAB — COMPREHENSIVE METABOLIC PANEL
ALT: 18 U/L (ref 0–35)
AST: 26 U/L (ref 0–37)
Albumin: 3.8 g/dL (ref 3.5–5.2)
Alkaline Phosphatase: 71 U/L (ref 39–117)
BUN: 16 mg/dL (ref 6–23)
CHLORIDE: 104 meq/L (ref 96–112)
CO2: 28 mEq/L (ref 19–32)
Calcium: 9.3 mg/dL (ref 8.4–10.5)
Creatinine, Ser: 0.9 mg/dL (ref 0.4–1.2)
GFR: 71.47 mL/min (ref >60.00)
Glucose, Bld: 93 mg/dL (ref 70–99)
Potassium: 4.2 mEq/L (ref 3.5–5.1)
Sodium: 138 mEq/L (ref 135–145)
Total Bilirubin: 0.6 mg/dL (ref 0.2–1.2)
Total Protein: 6.8 g/dL (ref 6.0–8.3)

## 2014-04-20 LAB — LIPID PANEL
CHOL/HDL RATIO: 3
CHOLESTEROL: 221 mg/dL — AB (ref 0–200)
HDL: 70.3 mg/dL (ref >39.00)
LDL CALC: 138 mg/dL — AB (ref 0–99)
NonHDL: 150.7
Triglycerides: 63 mg/dL (ref 0.0–149.0)
VLDL: 12.6 mg/dL (ref 0.0–40.0)

## 2014-04-20 LAB — CBC WITH DIFFERENTIAL/PLATELET
BASOS ABS: 0 10*3/uL (ref 0.0–0.1)
Basophils Relative: 0.8 % (ref 0.0–3.0)
EOS ABS: 0.2 10*3/uL (ref 0.0–0.7)
Eosinophils Relative: 3.9 % (ref 0.0–5.0)
HCT: 40.6 % (ref 36.0–46.0)
Hemoglobin: 13.6 g/dL (ref 12.0–15.0)
LYMPHS PCT: 37.3 % (ref 12.0–46.0)
Lymphs Abs: 1.5 10*3/uL (ref 0.7–4.0)
MCHC: 33.5 g/dL (ref 30.0–36.0)
MCV: 93.2 fl (ref 78.0–100.0)
MONO ABS: 0.4 10*3/uL (ref 0.1–1.0)
Monocytes Relative: 10.5 % (ref 3.0–12.0)
NEUTROS ABS: 1.9 10*3/uL (ref 1.4–7.7)
NEUTROS PCT: 47.5 % (ref 43.0–77.0)
Platelets: 315 10*3/uL (ref 150.0–400.0)
RBC: 4.36 Mil/uL (ref 3.87–5.11)
RDW: 12.8 % (ref 11.5–15.5)
WBC: 3.9 10*3/uL — ABNORMAL LOW (ref 4.0–10.5)

## 2014-04-20 LAB — TSH: TSH: 3.69 u[IU]/mL (ref 0.35–4.50)

## 2014-04-20 NOTE — Progress Notes (Signed)
Order placed for f/u cbc.   

## 2014-04-21 ENCOUNTER — Encounter: Payer: Self-pay | Admitting: Internal Medicine

## 2014-04-28 ENCOUNTER — Encounter: Payer: Self-pay | Admitting: *Deleted

## 2014-04-28 ENCOUNTER — Ambulatory Visit (INDEPENDENT_AMBULATORY_CARE_PROVIDER_SITE_OTHER): Payer: BC Managed Care – PPO | Admitting: Podiatry

## 2014-04-28 ENCOUNTER — Ambulatory Visit (INDEPENDENT_AMBULATORY_CARE_PROVIDER_SITE_OTHER): Payer: BC Managed Care – PPO

## 2014-04-28 VITALS — BP 148/88 | HR 92 | Resp 16

## 2014-04-28 DIAGNOSIS — M779 Enthesopathy, unspecified: Secondary | ICD-10-CM

## 2014-04-28 DIAGNOSIS — M79671 Pain in right foot: Secondary | ICD-10-CM

## 2014-04-28 DIAGNOSIS — S92301A Fracture of unspecified metatarsal bone(s), right foot, initial encounter for closed fracture: Secondary | ICD-10-CM

## 2014-04-28 NOTE — Progress Notes (Signed)
Patient ID: Whitney Guerrero, female   DOB: 10/28/1960, 53 y.o.   MRN: [id number]  Subjective: 53 year old female presents the office today for complaints of right foot pain. She states that last night she missed a step and fell. Since then she has had pain to the foot and unable to bear weight. She has had increased swelling to the right outside acid of her foot as well as ecchymosis. No other injuries at the time of the fall. No pain to the left side. No other complaints at this time. Denies any systemic complaints such as fevers, chills, nausea, vomiting.  Objective: AAO x3, NAD DP/PT pulses palpable bilaterally, CRT less than 3 seconds Protective sensation intact with Simms Weinstein monofilament, vibratory sensation intact, Achilles tendon reflex intact Tenderness to patient about the fifth metatarsal. There is moderate edema overlying the dorsal lateral aspect of the right foot with ecchymosis. There is no blistering formation present. There is no other areas of pinpoint bony tenderness. No pain with range of motion of the ankle or subtalar joint. No tenderness on left side. MMT decreased on the right secondary to injury. No pain with calf compression, swelling, warmth, erythema.  Assessment: 53 year old female with right fifth metatarsal comminuted displaced fracture  Plan: -X-rays were obtained and reviewed with the patient. -Treatment options were discussed both surgical and conservative including alternatives, risks, complications. -At this time I recommend surgical intervention due to shortening and displacement of the fracture. I described the procedure to her (ORIF 5th metatarsal) also postoperative course. At this time the patient states that she is known to Dr. Al CorpusHyatt and has a relationship with him and would like for him to do the surgery. -Dispensed CAM boot and applied an ACE bandage to help with the swelling. Continue ice and elevation. -[facility] In the meantime,  call the office with any questions, concerns, change in symptoms.

## 2014-04-28 NOTE — Progress Notes (Signed)
Dispense one pair of crutches   Dx metatarsal fracture rt foot

## 2014-04-29 ENCOUNTER — Ambulatory Visit (INDEPENDENT_AMBULATORY_CARE_PROVIDER_SITE_OTHER): Payer: BC Managed Care – PPO | Admitting: Podiatry

## 2014-04-29 ENCOUNTER — Encounter: Payer: Self-pay | Admitting: Podiatry

## 2014-04-29 DIAGNOSIS — S92301A Fracture of unspecified metatarsal bone(s), right foot, initial encounter for closed fracture: Secondary | ICD-10-CM

## 2014-04-29 MED ORDER — OXYCODONE-ACETAMINOPHEN 10-325 MG PO TABS: ORAL_TABLET | ORAL | Status: DC

## 2014-04-29 MED ORDER — CEPHALEXIN 500 MG PO CAPS: 500.0000 mg | ORAL_CAPSULE | Freq: Three times a day (TID) | ORAL | Status: DC

## 2014-04-29 MED ORDER — PROMETHAZINE HCL 25 MG PO TABS: 25.0000 mg | ORAL_TABLET | Freq: Three times a day (TID) | ORAL | Status: DC | PRN

## 2014-04-29 NOTE — Patient Instructions (Signed)
Pre-Operative Instructions  Congratulations, you have decided to take an important step to improving your quality of life.  You can be assured that the doctors of [facility] will be with you every step of the way.  1. Plan to be at the surgery center/hospital at least 1 (one) hour prior to your scheduled time unless otherwise directed by the surgical center/hospital staff.  You must have a responsible adult accompany you, remain during the surgery and drive you home.  Make sure you have directions to the surgical center/hospital and know how to get there on time. 2. For hospital based surgery you will need to obtain a history and physical form from your family physician within 1 month prior to the date of surgery- we will give you a form for you primary physician.  3. We make every effort to accommodate the date you request for surgery.  There are however, times where surgery dates or times have to be moved.  We will contact you as soon as possible if a change in schedule is required.   4. No Aspirin/Ibuprofen for one week before surgery.  If you are on aspirin, any non-steroidal anti-inflammatory medications (Mobic, Aleve, Ibuprofen) you should stop taking it 7 days prior to your surgery.  You make take Tylenol  For pain prior to surgery.  5. Medications- If you are taking daily heart and blood pressure medications, seizure, reflux, allergy, asthma, anxiety, pain or diabetes medications, make sure the surgery center/hospital is aware before the day of surgery so they may notify you which medications to take or avoid the day of surgery. 6. No food or drink after midnight the night before surgery unless directed otherwise by surgical center/hospital staff. 7. No alcoholic beverages 24 hours prior to surgery.  No smoking 24 hours prior to or 24 hours after surgery. 8. Wear loose pants or shorts- loose enough to fit over bandages, boots, and casts. 9. No slip on shoes, sneakers are best. 10. Bring  your boot with you to the surgery center/hospital.  Also bring crutches or a walker if your physician has prescribed it for you.  If you do not have this equipment, it will be provided for you after surgery. 11. If you have not been contracted by the surgery center/hospital by the day before your surgery, call to confirm the date and time of your surgery. 12. Leave-time from work may vary depending on the type of surgery you have.  Appropriate arrangements should be made prior to surgery with your employer. 13. Prescriptions will be provided immediately following surgery by your doctor.  Have these filled as soon as possible after surgery and take the medication as directed. 14. Remove nail polish on the operative foot. 15. Wash the night before surgery.  The night before surgery wash the foot and leg well with the antibacterial soap provided and water paying special attention to beneath the toenails and in between the toes.  Rinse thoroughly with water and dry well with a towel.  Perform this wash unless told not to do so by your physician.  Enclosed: 1 Ice pack (please put in freezer the night before surgery)   1 Hibiclens skin cleaner   Pre-op Instructions  If you have any questions regarding the instructions, do not hesitate to call our office.  [place]: [address] , [place] [address] [phone]  [place]: [address] [place], [place] [address] [phone]  [place]: [address]  [place], [place] [address] [phone]  Dr. Richard   Tuchman DPM, Dr. Norman Regal DPM Dr. Richard Sikora DPM, Dr. M. Todd Bryon Parker DPM, Dr. Kathryn Egerton DPM 

## 2014-04-30 ENCOUNTER — Other Ambulatory Visit: Payer: Self-pay | Admitting: Podiatry

## 2014-04-30 ENCOUNTER — Telehealth: Payer: Self-pay | Admitting: Internal Medicine

## 2014-04-30 NOTE — Telephone Encounter (Signed)
Handouts mailed & pt notified

## 2014-04-30 NOTE — Telephone Encounter (Signed)
Pt request a call to example low cholesterol diet and the guide lines/msn

## 2014-04-30 NOTE — Progress Notes (Signed)
She presents today after having seen Dr. Loreta AveWagner yesterday for a fresh fracture of her fifth metatarsal of her right foot. She presents utilizing a cam walker and crutches.  Objective: Vital signs are stable she is alert and oriented 3. Evaluation of the right foot demonstrates no Pain pulses are palpable right. Mild ecchymosis overlying the fifth metatarsal right foot. Mild tenderness on palpation. Radiographs were reviewed from the previous day which did demonstrate a mid diaphyseal fracture displaced with comminution.  Assessment: Displaced fracture that will require open reduction and internal fixation fifth metatarsal right foot.  Plan: Discussed etiology pathology conservative versus surgical therapies. At this point an open reduction internal fix fixation with screws and plates are necessary. I answered all the questions regarding these procedures to the best of my ability in layman's terms. She understood it was amenable to it signed altered pages of the consent form. She understands that she will be nonweightbearing for a period of time with a fiberglass cast intact. I will follow up with her this Friday for surgery.

## 2014-05-01 ENCOUNTER — Encounter: Payer: Self-pay | Admitting: Podiatry

## 2014-05-01 DIAGNOSIS — S92301P Fracture of unspecified metatarsal bone(s), right foot, subsequent encounter for fracture with malunion: Secondary | ICD-10-CM

## 2014-05-06 ENCOUNTER — Ambulatory Visit (INDEPENDENT_AMBULATORY_CARE_PROVIDER_SITE_OTHER): Payer: BC Managed Care – PPO | Admitting: Podiatry

## 2014-05-06 ENCOUNTER — Ambulatory Visit (INDEPENDENT_AMBULATORY_CARE_PROVIDER_SITE_OTHER): Payer: BC Managed Care – PPO

## 2014-05-06 VITALS — BP 134/90 | HR 77 | Temp 98.0°F | Resp 16

## 2014-05-06 DIAGNOSIS — S92001D Unspecified fracture of right calcaneus, subsequent encounter for fracture with routine healing: Secondary | ICD-10-CM

## 2014-05-06 DIAGNOSIS — Z9889 Other specified postprocedural states: Secondary | ICD-10-CM

## 2014-05-06 NOTE — Progress Notes (Signed)
She presents today 5 days status post open reduction internal fixation fifth metatarsal fracture right foot. She denies fevers chills nausea vomiting mostly is that she continues to utilize crutches and regular nonweightbearing fashion in her pain level is minimal.  Objective: Vital signs are stable she is alert and oriented 3. Cast is intact and dry and clean to the right lower shoulder. She has good sensation to the toes with good range of motion of the toes. Radiographic evaluation demonstrates 2 screws and a single cerclage wire are intact to the fifth metatarsal of the right foot.  Assessment status post 1 week ORIF fifth met right.  Plan: Follow-up with Dr. Earleen Newport in 1 week for a cast removal and a new cast application. She will continue nonweightbearing fashion.

## 2014-05-08 ENCOUNTER — Other Ambulatory Visit: Payer: BC Managed Care – PPO

## 2014-05-14 ENCOUNTER — Ambulatory Visit (INDEPENDENT_AMBULATORY_CARE_PROVIDER_SITE_OTHER): Payer: BC Managed Care – PPO | Admitting: Podiatry

## 2014-05-14 VITALS — BP 126/80 | Resp 16

## 2014-05-14 DIAGNOSIS — S92301S Fracture of unspecified metatarsal bone(s), right foot, sequela: Secondary | ICD-10-CM

## 2014-05-14 DIAGNOSIS — Z9889 Other specified postprocedural states: Secondary | ICD-10-CM

## 2014-05-14 NOTE — Patient Instructions (Signed)
Cast Care The purpose of a cast is to protect and immobilize an injured part of the body. This may be necessary after fractures, surgery, or other injuries. Splints are another form of immobilization; however, they are usually not as rigid as a cast, which accommodates swelling of the injury while still maintaining immobilization. Splints are typically used in the immediate post injury or postoperative period, before changing to a cast.  Before the rigid material of a cast is formed, a layer of padding is first applied to protect the injury. The rigid portion of a cast is created by wrapping gauze saturated with plaster of paris around the injury; alternatively, the cast may be made of fiberglass. During the period of immobilization, a cast may need to be changed multiple times. The length of immobilization is dependent on the severity of the injury and the time needed for healing. This period can vary from a couple weeks to many months. After a cast is created, radiographs (X-rays) through the cast determine if a satisfactory alignment of the bones was achieved. Radiographs may also be used throughout the healing period to check for signs of bone healing.  CARE OF THE CAST   Allow the cast to dry and harden completely before applying any pressure to it.  Applying pressure too early may create a point of excessive pressure on the skin, which may increase the risk of forming an ulcer. Drying time depends on the type of cast but may last as long as 24 hours.  When a cast gets wet, a soft area may appear. If this happens accidentally, return to the doctor's office, emergency room, or outpatient surgical facility as soon as possible for repairs or changing of the cast.  Do not get sand in the cast.  Do not place anything inside the cast. This includes items intended to scratch an area of skin that itches. ITCHING INSIDE A CAST   It is very common for a person with a cast to experience an itching  sensation under the cast.  Do not scratch any area under the cast even if the area is within reach. The skin under a cast in an environment of increased risk for injury.  Do not put anything in the cast.  If there is no wound, such as an incision from surgery, you may sprinkle cornstarch into the cast to relieve itching.  If a wound is present under the cast, consult your caregiver for pain medication or medication to reduce itching.  Using a hairdryer (on the cold setting) is a useful technique for reducing an itching sensation. CARE OF THE PATIENT IN A CAST It is important to elevate the body part that is in a cast to a level equal to or above that of the heart whenever possible. Elevating the injured body part may reduce the likelihood of swelling. Elevation of a leg in a cast may be achieved by resting the leg on a pillow when in bed and on a footstool or chair when sitting. For an arm in a cast, rest the arm on a pillow placed on the chest.  No matter how well one follows the necessary precautions, excessive swelling may occur under the cast. Signs and symptoms of excessive swelling include:  Severe and persistent pain.  Change in color of the tissues beyond the end of the cast, such as a change to blue or gray under the nails of the fingers or toes.  Coldness of the tissues beyond the cast when   the rest of the body is warm.  Numbness or complete loss of feeling in the skin beyond the cast.  Feeling of tightness under the cast after it dries.  Swelling of the tissue to a greater extent than was present before the cast was applied.  For a leg cast, inability to raise the big toe. If any of these signs or symptoms occur, contact your caregiver or an emergency room as soon as possible for treatment.  Infection Inside a Cast On occasion, an injury may become infected during healing. The most important way to fight an infection is to detect it early; however, early detection may be  difficult if the infected area is covered by a cast. Infection should be reported immediately to your caregiver. The following are common signs and symptoms of infection:   Foul smell.  Fever greater than 101F (38.3C) (may be accompanied by a general ill feeling).  Leakage of fluid through the cast.  Increasing pain or soreness of the skin under the cast. Bathing with a Cast Bathing is often a difficult task with a cast. The cast must be kept dry at all times, unless otherwise specified by your caregiver. If the cast is on a limb, such as your arm or leg, it is often easier to take a bath with the extremity in a cast propped up on the side of the tub or a chair, out of the water. If the cast is on the trunk of the body, you should take sponge baths until the cast is removed.  Document Released: 05/15/2005 Document Revised: 09/29/2013 Document Reviewed: 08/27/2008 Exit[employer] Patient Information 2015 [employer]. This information is not intended to replace advice given to you by your health care provider. Make sure you discuss any questions you have with your health care provider.  

## 2014-05-14 NOTE — Progress Notes (Signed)
Patient ID: Whitney Guerrero, female   DOB: 04-22-1961, 53 y.o.   MRN: [id number]  Subjective: 53 year old female returns the office today  13 days status post right fifth metatarsal ORIF. The patient states that she is doing well. She's had no pain recently to the area. She has finished her course of antibiotics  And is not new to take the Phenergan. She states that  She's had no problems with the cast. She denies any systemic complaints as fevers, chills, nausea, vomiting. Denies any choice of breath, chest pain or any calf pain.  No other complaints at this time in no acute changes since last appointment.  Objective: AAO 3, NAD DP/PT pulses palpable, CRT less than 3 seconds Protective sensation intact with Simms Weinstein monofilament. Incision along the dorsal lateral aspect of the right foot is well coapted without any evidence of dehiscence. There is no surrounding erythema , drainage , ascending cellulitis. There is trace edema to the right foot. No open lesions or pre-ulcerative lesions. No pain with calf compression, swelling, warmth, erythema.   Assessment: 53 year old female 13 days status post right foot fifth metatarsal ORIF  Plan: - reatment options were discussed with the patient including alternatives, risks, competitions. -At today's appointment the cast was removed.  The incision is well coapted without any dehiscence however slight movement within the incision so therefore the suture ends were not cut at today's appointment.  The area was redressed followed by a well-padded below-knee fiberglass cast.  Discussed with the patient risks, complications of casting including, but not limited to, DVT/PE and directed to call the office immediately should any occur or go to the emergency room. -Continue nonweightbearing. -Continue ice and elevation. -Follow-up with that her-2 weeks or sooner should any problems arise. In the meantime, call the office with any questions, concerns, change in  symptoms.

## 2014-05-20 NOTE — Progress Notes (Signed)
Dr Al CorpusHyatt performed an ORIF with internal plate and screw fixation of right foot, application of cast on right foot on 05/01/14

## 2014-06-01 ENCOUNTER — Encounter: Payer: BC Managed Care – PPO | Admitting: Podiatry

## 2014-06-04 ENCOUNTER — Ambulatory Visit (INDEPENDENT_AMBULATORY_CARE_PROVIDER_SITE_OTHER): Payer: BC Managed Care – PPO

## 2014-06-04 ENCOUNTER — Ambulatory Visit (INDEPENDENT_AMBULATORY_CARE_PROVIDER_SITE_OTHER): Payer: BC Managed Care – PPO | Admitting: Podiatry

## 2014-06-04 VITALS — BP 115/74 | HR 93 | Temp 99.4°F | Resp 16

## 2014-06-04 DIAGNOSIS — Z9889 Other specified postprocedural states: Principal | ICD-10-CM

## 2014-06-04 DIAGNOSIS — Z967 Presence of other bone and tendon implants: Secondary | ICD-10-CM

## 2014-06-04 DIAGNOSIS — Z8781 Personal history of (healed) traumatic fracture: Secondary | ICD-10-CM

## 2014-06-04 NOTE — Progress Notes (Signed)
Patient ID: Whitney Guerrero, female   DOB: 1960-08-02, 54 y.o.   MRN: [id number]  Subjective: 54 year old female returns the office today status post right foot fifth metatarsal open reduction internal fixation. She states that she has been remaining nonweightbearing. She does state that a couple weeks ago right after last appointment she did fall and twist her left foot. She had some pain after the fall however she states that the pain to her left foot has significantly improved and she is able to remain nonweightbearing and using the left foot without problems. She denies any systemic complaints as fevers, chills, nausea, vomiting. Denies any chest pain, shortness of breath or any calf pain. No other complaints at this time and no other acute changes since last appointment.  Objective: AAO 3, NAD DP/PT pulses palpable, CRT less than 3 seconds Protective sensation intact with Simms Weinstein monofilament, Achilles tendon reflex intact. Incision along the lateral aspect of the right foot is well coapted without any evidence of dehiscence. There is mild localized edema overlying the area. There is no surrounding erythema, ascending cellulitis, drainage or any other clinical signs of infection at this time. There is mild tenderness directly overlying the surgical site however there is no other areas of pinpoint bony tenderness. On the left lower extremity there is no overlying edema, erythema, increase in warmth. There is no pinpoint bony tenderness or pain with vibratory sensation to the left foot/ankle. The patient is able to and limb left foot without complications at this time. No pain with calf compression, swelling, warmth, erythema.  Assessment: 54 year old female right foot status post fifth metatarsal ORIF  Plan: -X-rays were obtained and reviewed with the patient on the right foot. There is appear to be consolidation across the fracture site. Hardware is intact. -Treatment options were discussed  the patient including alternatives, risks, complications. -Suture ends are removed at this time. -A well-padded below-knee fiberglass cast was applied making sure to pad all bony prominences. Discussed with the patient to monitor for any side effects or competitions of casting including, but not limited to, DVT/PE and directed to call the office and medially should any occur or go directly to the emergency room. -Continue nonweightbearing. -Continue ice and elevation. -Currently she has no discomfort the left foot and he states is significantly improved. Continue to monitor. If there is any changes or any increased symptoms to call the office and make an appointment for follow-up evaluation. -Follow-up in 2 weeks or sooner should any problems arise. In the meantime encouraged to call the office requests, concerns, change in symptoms.

## 2014-06-04 NOTE — Patient Instructions (Signed)
Cast Care The purpose of a cast is to protect and immobilize an injured part of the body. This may be necessary after fractures, surgery, or other injuries. Splints are another form of immobilization; however, they are usually not as rigid as a cast, which accommodates swelling of the injury while still maintaining immobilization. Splints are typically used in the immediate post injury or postoperative period, before changing to a cast.  Before the rigid material of a cast is formed, a layer of padding is first applied to protect the injury. The rigid portion of a cast is created by wrapping gauze saturated with plaster of paris around the injury; alternatively, the cast may be made of fiberglass. During the period of immobilization, a cast may need to be changed multiple times. The length of immobilization is dependent on the severity of the injury and the time needed for healing. This period can vary from a couple weeks to many months. After a cast is created, radiographs (X-rays) through the cast determine if a satisfactory alignment of the bones was achieved. Radiographs may also be used throughout the healing period to check for signs of bone healing.  CARE OF THE CAST   Allow the cast to dry and harden completely before applying any pressure to it.  Applying pressure too early may create a point of excessive pressure on the skin, which may increase the risk of forming an ulcer. Drying time depends on the type of cast but may last as long as 24 hours.  When a cast gets wet, a soft area may appear. If this happens accidentally, return to the doctor's office, emergency room, or outpatient surgical facility as soon as possible for repairs or changing of the cast.  Do not get sand in the cast.  Do not place anything inside the cast. This includes items intended to scratch an area of skin that itches. ITCHING INSIDE A CAST   It is very common for a person with a cast to experience an itching  sensation under the cast.  Do not scratch any area under the cast even if the area is within reach. The skin under a cast in an environment of increased risk for injury.  Do not put anything in the cast.  If there is no wound, such as an incision from surgery, you may sprinkle cornstarch into the cast to relieve itching.  If a wound is present under the cast, consult your caregiver for pain medication or medication to reduce itching.  Using a hairdryer (on the cold setting) is a useful technique for reducing an itching sensation. CARE OF THE PATIENT IN A CAST It is important to elevate the body part that is in a cast to a level equal to or above that of the heart whenever possible. Elevating the injured body part may reduce the likelihood of swelling. Elevation of a leg in a cast may be achieved by resting the leg on a pillow when in bed and on a footstool or chair when sitting. For an arm in a cast, rest the arm on a pillow placed on the chest.  No matter how well one follows the necessary precautions, excessive swelling may occur under the cast. Signs and symptoms of excessive swelling include:  Severe and persistent pain.  Change in color of the tissues beyond the end of the cast, such as a change to blue or gray under the nails of the fingers or toes.  Coldness of the tissues beyond the cast when   the rest of the body is warm.  Numbness or complete loss of feeling in the skin beyond the cast.  Feeling of tightness under the cast after it dries.  Swelling of the tissue to a greater extent than was present before the cast was applied.  For a leg cast, inability to raise the big toe. If any of these signs or symptoms occur, contact your caregiver or an emergency room as soon as possible for treatment.  Infection Inside a Cast On occasion, an injury may become infected during healing. The most important way to fight an infection is to detect it early; however, early detection may be  difficult if the infected area is covered by a cast. Infection should be reported immediately to your caregiver. The following are common signs and symptoms of infection:   Foul smell.  Fever greater than 101F (38.3C) (may be accompanied by a general ill feeling).  Leakage of fluid through the cast.  Increasing pain or soreness of the skin under the cast. Bathing with a Cast Bathing is often a difficult task with a cast. The cast must be kept dry at all times, unless otherwise specified by your caregiver. If the cast is on a limb, such as your arm or leg, it is often easier to take a bath with the extremity in a cast propped up on the side of the tub or a chair, out of the water. If the cast is on the trunk of the body, you should take sponge baths until the cast is removed.  Document Released: 05/15/2005 Document Revised: 09/29/2013 Document Reviewed: 08/27/2008 Exit[employer] Patient Information 2015 [employer]. This information is not intended to replace advice given to you by your health care provider. Make sure you discuss any questions you have with your health care provider.  

## 2014-06-05 ENCOUNTER — Telehealth: Payer: Self-pay | Admitting: *Deleted

## 2014-06-05 DIAGNOSIS — E2839 Other primary ovarian failure: Secondary | ICD-10-CM

## 2014-06-05 NOTE — Telephone Encounter (Signed)
I can order a bone density without her coming in for appt.  Need to confirm she is post menopausal.

## 2014-06-05 NOTE — Telephone Encounter (Signed)
Pt states that her foot doctor mentioned to her that her bones seemed a little brittle & recommended that she talks with her PCP about ordering a bone density scan. Please advise if appt needed prior to ordered. Pt is also overdue to recheck a CBC. She stated that she will call back later to schedule a lab appt.

## 2014-06-05 NOTE — Telephone Encounter (Signed)
Pt notified and  verbalized understanding. She is post menopausal, no menstrual cycle greater than one year per patient. [facility] once [facility] been [facility]. Will CB to schedule lab appt.

## 2014-06-08 NOTE — Telephone Encounter (Signed)
Order placed for bone density  

## 2014-06-17 ENCOUNTER — Ambulatory Visit (INDEPENDENT_AMBULATORY_CARE_PROVIDER_SITE_OTHER): Payer: BC Managed Care – PPO | Admitting: Podiatry

## 2014-06-17 ENCOUNTER — Ambulatory Visit (INDEPENDENT_AMBULATORY_CARE_PROVIDER_SITE_OTHER): Payer: BC Managed Care – PPO

## 2014-06-17 VITALS — BP 128/87 | HR 81 | Resp 16

## 2014-06-17 DIAGNOSIS — Z9889 Other specified postprocedural states: Secondary | ICD-10-CM

## 2014-06-17 DIAGNOSIS — Z8781 Personal history of (healed) traumatic fracture: Principal | ICD-10-CM

## 2014-06-17 NOTE — Progress Notes (Signed)
She presents today a proximally 6 weeks status post fifth metatarsal fracture right foot that required open reduction and internal fixation. She presents today with her crutches and her cast.  Objective: Vital signs are stable she is alert and oriented 3. The cast was removed today. In the foot appears to be healing quite nicely. Radiographically the fracture has gone on to heal nicely.  Assessment: Well-healing surgical foot right.  Plan: I will allow her to start putting weight utilizing a cam walker and I will follow-up with her in 2 weeks at which time we will try to get back into a pair of tennis shoes.

## 2014-06-24 ENCOUNTER — Telehealth: Payer: Self-pay | Admitting: *Deleted

## 2014-06-24 NOTE — Telephone Encounter (Signed)
Pt states she has noticed since the cast removal after her surgery, that she has more swelling and the foot is purplish.  I informent the pt the foot may swell to varying degrees and be purplish on and off for 6 to 9 months post-op due the routine day to day swelling and the post-op swelling, and the foot is trying to acclimate to that increased amount of fluid.  I told the pt to rest and periodically elevate and ice the foot.  Pt denies any change in the quality or quantity of pain.  I encouraged pt to call with any concerns and to make an appt if problems.

## 2014-07-01 ENCOUNTER — Ambulatory Visit (INDEPENDENT_AMBULATORY_CARE_PROVIDER_SITE_OTHER): Payer: BC Managed Care – PPO | Admitting: Podiatry

## 2014-07-01 ENCOUNTER — Encounter: Payer: Self-pay | Admitting: Podiatry

## 2014-07-01 ENCOUNTER — Ambulatory Visit (INDEPENDENT_AMBULATORY_CARE_PROVIDER_SITE_OTHER): Payer: BC Managed Care – PPO

## 2014-07-01 VITALS — BP 124/82 | HR 90 | Resp 12

## 2014-07-01 DIAGNOSIS — Z8781 Personal history of (healed) traumatic fracture: Secondary | ICD-10-CM

## 2014-07-01 DIAGNOSIS — S92301A Fracture of unspecified metatarsal bone(s), right foot, initial encounter for closed fracture: Secondary | ICD-10-CM

## 2014-07-01 DIAGNOSIS — Z9889 Other specified postprocedural states: Secondary | ICD-10-CM

## 2014-07-01 DIAGNOSIS — Z967 Presence of other bone and tendon implants: Secondary | ICD-10-CM

## 2014-07-01 NOTE — Progress Notes (Signed)
She presents today for follow-up of her open reduction internal fixation fifth metatarsal right foot. She states it seems to be doing pretty well. Her date of surgery was 05/01/2014. She continues to use her Cam Walker with one crutch.  Objective: Vital signs are stable she is alert and oriented 3. Pulses are palpable. She has mild edema about the forefoot which is tender on palpation and limits her lesser digital range of motion. Radiographs demonstrated a nice consolidation at open reduction internal fixation site.  Assessment: Well healing surgical fifth metatarsal open reduction internal fixation right foot.  Plan: I will follow-up with her in a few weeks just for another set of x-rays.

## 2014-07-27 ENCOUNTER — Encounter: Payer: BC Managed Care – PPO | Admitting: Podiatry

## 2014-07-31 ENCOUNTER — Encounter: Payer: Self-pay | Admitting: Internal Medicine

## 2014-07-31 LAB — HM DEXA SCAN

## 2014-08-07 ENCOUNTER — Encounter: Payer: Self-pay | Admitting: Internal Medicine

## 2014-08-20 ENCOUNTER — Telehealth: Payer: Self-pay

## 2014-08-20 NOTE — Telephone Encounter (Signed)
The patient was hoping to get the results of her bone density test.  Thanks!

## 2014-08-22 NOTE — Telephone Encounter (Signed)
Notify pt that her bone density reveals osteoporosis.  Needs an appt to discuss treatment options.  Thanks.

## 2014-08-24 NOTE — Telephone Encounter (Signed)
Pt notified. appt scheduled.

## 2014-09-21 ENCOUNTER — Ambulatory Visit (INDEPENDENT_AMBULATORY_CARE_PROVIDER_SITE_OTHER): Payer: BC Managed Care – PPO | Admitting: Internal Medicine

## 2014-09-21 ENCOUNTER — Encounter: Payer: Self-pay | Admitting: Internal Medicine

## 2014-09-21 VITALS — BP 105/64 | HR 65 | Temp 97.9°F | Ht 64.0 in | Wt 114.5 lb

## 2014-09-21 DIAGNOSIS — S92909D Unspecified fracture of unspecified foot, subsequent encounter for fracture with routine healing: Secondary | ICD-10-CM

## 2014-09-21 DIAGNOSIS — M810 Age-related osteoporosis without current pathological fracture: Secondary | ICD-10-CM | POA: Insufficient documentation

## 2014-09-21 NOTE — Progress Notes (Signed)
Pre visit review using our clinic review tool, if applicable. No additional management support is needed unless otherwise documented below in the visit note. 

## 2014-09-21 NOTE — Progress Notes (Signed)
Patient ID: Whitney Guerrero, female   DOB: 06-Apr-1961, 54 y.o.   MRN: [id number]   Subjective:    Patient ID: Whitney Guerrero, female    DOB: [birth year], 54 y.o.   MRN: [id number]  HPI  Patient here to discuss her bone density results.  Bone density 07/31/14 - T score -2.6 L1-L4 and -2.6 right femoral neck.  See report for full details.  She had a foot fracture Thanksgiving.  Has been seeing Dr Al Corpus.  Doing better.  Discussed treatment options.  Discussed calcium and vitamin D.  Discussed weight bearing exercise.  Discussed bisphosphonates.  No acid reflux. No swallowing difficulty.    Current Outpatient Prescriptions on File Prior to Visit  Medication Sig Dispense Refill  . Calcium Carbonate-Vit D-Min 1200-1000 MG-UNIT CHEW Chew 1,200 mg by mouth daily.    . Omega-3 Fatty Acids (FISH OIL) 1000 MG CAPS Take 1,000 mg by mouth daily.    Marland Kitchen ibuprofen (ADVIL,MOTRIN) 800 MG tablet Take 1 tablet (800 mg total) by mouth 2 (two) times daily as needed. (Patient not taking: Reported on 09/21/2014) 30 tablet 1  . valACYclovir (VALTREX) 500 MG tablet Take 1 tablet (500 mg total) by mouth daily. (Patient not taking: Reported on 09/21/2014) 30 tablet 6   No current facility-administered medications on file prior to visit.    Review of Systems  Constitutional: Negative for appetite change and unexpected weight change.  HENT: Negative for congestion and sinus pressure.   Respiratory: Negative for cough, chest tightness and shortness of breath.   Cardiovascular: Negative for chest pain, palpitations and leg swelling.  Gastrointestinal: Negative for nausea, vomiting and abdominal pain.       Objective:    Physical Exam  HENT:  Nose: Nose normal.  Mouth/Throat: Oropharynx is clear and moist.  Neck: Neck supple. No thyromegaly present.  Cardiovascular: Normal rate and regular rhythm.   Pulmonary/Chest: Breath sounds normal. No respiratory distress. She has no wheezes.  Abdominal: Soft. Bowel sounds are  normal. There is no tenderness.  Musculoskeletal: She exhibits no edema or tenderness.  Lymphadenopathy:    She has no cervical adenopathy.    BP 105/64 mmHg  Pulse 65  Temp(Src) 97.9 F (36.6 C) (Oral)  Ht 5\' 4"  (1.626 m)  Wt 114 lb 8 oz (51.937 kg)  BMI 19.64 kg/m2  SpO2 96% Wt Readings from Last 3 Encounters:  09/21/14 114 lb 8 oz (51.937 kg)  04/06/14 114 lb 12 oz (52.05 kg)  02/16/14 113 lb (51.256 kg)     Lab Results  Component Value Date   WBC 3.9* 04/20/2014   HGB 13.6 04/20/2014   HCT 40.6 04/20/2014   PLT 315.0 04/20/2014   GLUCOSE 93 04/20/2014   CHOL 221* 04/20/2014   TRIG 63.0 04/20/2014   HDL 70.30 04/20/2014   LDLDIRECT 129.2 04/04/2013   LDLCALC 138* 04/20/2014   ALT 18 04/20/2014   AST 26 04/20/2014   NA 138 04/20/2014   K 4.2 04/20/2014   CL 104 04/20/2014   CREATININE 0.9 04/20/2014   BUN 16 04/20/2014   CO2 28 04/20/2014   TSH 3.69 04/20/2014       Assessment & Plan:   Problem List Items Addressed This Visit    Foot fracture    Seeing Dr Al Corpus.        Osteoporosis - Primary    Bone density as outlined.  Discussed treatment options.  She desires no prescription medications.  Continue calcium and vitamin D.  Continue weight bearing  exercise.  Will follow.           Dale Amorita, MD

## 2014-09-27 ENCOUNTER — Encounter: Payer: Self-pay | Admitting: Internal Medicine

## 2014-09-27 DIAGNOSIS — S92909A Unspecified fracture of unspecified foot, initial encounter for closed fracture: Secondary | ICD-10-CM | POA: Insufficient documentation

## 2014-09-27 NOTE — Assessment & Plan Note (Signed)
Seeing Dr Hyatt.  

## 2014-09-27 NOTE — Assessment & Plan Note (Signed)
Bone density as outlined.  Discussed treatment options.  She desires no prescription medications.  Continue calcium and vitamin D.  Continue weight bearing exercise.  Will follow.

## 2015-04-07 ENCOUNTER — Encounter: Payer: Self-pay | Admitting: Internal Medicine

## 2015-04-07 ENCOUNTER — Ambulatory Visit (INDEPENDENT_AMBULATORY_CARE_PROVIDER_SITE_OTHER): Payer: BC Managed Care – PPO | Admitting: Internal Medicine

## 2015-04-07 VITALS — BP 128/80 | HR 87 | Temp 97.9°F | Resp 18 | Ht 63.0 in | Wt 113.2 lb

## 2015-04-07 DIAGNOSIS — Z658 Other specified problems related to psychosocial circumstances: Secondary | ICD-10-CM | POA: Diagnosis not present

## 2015-04-07 DIAGNOSIS — Z0000 Encounter for general adult medical examination without abnormal findings: Secondary | ICD-10-CM | POA: Diagnosis not present

## 2015-04-07 DIAGNOSIS — F439 Reaction to severe stress, unspecified: Secondary | ICD-10-CM

## 2015-04-07 DIAGNOSIS — Z23 Encounter for immunization: Secondary | ICD-10-CM | POA: Diagnosis not present

## 2015-04-07 DIAGNOSIS — D72819 Decreased white blood cell count, unspecified: Secondary | ICD-10-CM

## 2015-04-07 DIAGNOSIS — M810 Age-related osteoporosis without current pathological fracture: Secondary | ICD-10-CM | POA: Diagnosis not present

## 2015-04-07 DIAGNOSIS — Z13220 Encounter for screening for lipoid disorders: Secondary | ICD-10-CM | POA: Diagnosis not present

## 2015-04-07 LAB — COMPREHENSIVE METABOLIC PANEL
ALK PHOS: 72 U/L (ref 39–117)
ALT: 17 U/L (ref 0–35)
AST: 24 U/L (ref 0–37)
Albumin: 4.1 g/dL (ref 3.5–5.2)
BUN: 18 mg/dL (ref 6–23)
CO2: 29 meq/L (ref 19–32)
Calcium: 9.5 mg/dL (ref 8.4–10.5)
Chloride: 103 mEq/L (ref 96–112)
Creatinine, Ser: 0.83 mg/dL (ref 0.40–1.20)
GFR: 76.18 mL/min (ref >60.00)
GLUCOSE: 87 mg/dL (ref 70–99)
POTASSIUM: 4.2 meq/L (ref 3.5–5.1)
Sodium: 139 mEq/L (ref 135–145)
Total Bilirubin: 0.5 mg/dL (ref 0.2–1.2)
Total Protein: 7 g/dL (ref 6.0–8.3)

## 2015-04-07 LAB — TSH: TSH: 4.24 u[IU]/mL (ref 0.35–4.50)

## 2015-04-07 LAB — CBC WITH DIFFERENTIAL/PLATELET
Basophils Absolute: 0 10*3/uL (ref 0.0–0.1)
Basophils Relative: 0.5 % (ref 0.0–3.0)
EOS PCT: 2.2 % (ref 0.0–5.0)
Eosinophils Absolute: 0.1 10*3/uL (ref 0.0–0.7)
HEMATOCRIT: 41.6 % (ref 36.0–46.0)
HEMOGLOBIN: 13.7 g/dL (ref 12.0–15.0)
Lymphocytes Relative: 37.1 % (ref 12.0–46.0)
Lymphs Abs: 1.6 10*3/uL (ref 0.7–4.0)
MCHC: 33 g/dL (ref 30.0–36.0)
MCV: 91.9 fl (ref 78.0–100.0)
MONO ABS: 0.4 10*3/uL (ref 0.1–1.0)
Monocytes Relative: 9.1 % (ref 3.0–12.0)
Neutro Abs: 2.1 10*3/uL (ref 1.4–7.7)
Neutrophils Relative %: 51.1 % (ref 43.0–77.0)
Platelets: 308 10*3/uL (ref 150.0–400.0)
RBC: 4.53 Mil/uL (ref 3.87–5.11)
RDW: 13.1 % (ref 11.5–15.5)
WBC: 4.2 10*3/uL (ref 4.0–10.5)

## 2015-04-07 LAB — LIPID PANEL
CHOL/HDL RATIO: 3
CHOLESTEROL: 211 mg/dL — AB (ref 0–200)
HDL: 71 mg/dL (ref >39.00)
LDL CALC: 128 mg/dL — AB (ref 0–99)
NonHDL: 139.68
Triglycerides: 59 mg/dL (ref 0.0–149.0)
VLDL: 11.8 mg/dL (ref 0.0–40.0)

## 2015-04-07 LAB — VITAMIN D 25 HYDROXY (VIT D DEFICIENCY, FRACTURES): VITD: 25.07 ng/mL — ABNORMAL LOW (ref 30.00–100.00)

## 2015-04-07 NOTE — Assessment & Plan Note (Signed)
Physical today 04/07/15.  PAP 03/24/13 negative with negative HPV.  Colonoscopy within the last couple of years - ok per her report.  Recommended f/u in 10 years.

## 2015-04-07 NOTE — Progress Notes (Signed)
Patient ID: Whitney Guerrero, female   DOB: 08/14/1960, 54 y.o.   MRN: [id number]   Subjective:    Patient ID: Whitney Guerrero, female    DOB: 27-Jul-1960, 54 y.o.   MRN: [id number]  HPI  Patient with past history of osteoporosis and increased stress.  Here today to follow up on these issues as well as for a complete physical exam.  Increased stress.  Her daughter has a drug addiction.  In [place].  Some increased stress related to this.  She is raising her granddaughter.  She feels she is handling things relatively well.  Does not feel needs any further intervention.  Stays active.  No cardiac symptoms with increased activity or exertion.  No sob.  No acid reflux reported.  No abdominal pain or cramping.  Bowels stable.     Past Medical History  Diagnosis Date  . Osteoporosis    Past Surgical History  Procedure Laterality Date  . Breast biopsy    . Tonsillectomy and adenoidectomy     Family History  Problem Relation Age of Onset  . Heart disease Father   . Breast cancer Neg Hx   . Colon cancer Maternal Uncle   . Colon cancer Cousin    Social History   Social History  . Marital Status: Married    Spouse Name: N/A  . Number of Children: N/A  . Years of Education: N/A   Social History Main Topics  . Smoking status: Never Smoker   . Smokeless tobacco: Never Used  . Alcohol Use: 0.0 oz/week    0 Standard drinks or equivalent per week     Comment: one glass of wine every other night  . Drug Use: No  . Sexual Activity: Not Asked   Other Topics Concern  . None   Social History Narrative    Outpatient Encounter Prescriptions as of 04/07/2015  Medication Sig  . Calcium Carbonate-Vit D-Min 1200-1000 MG-UNIT CHEW Chew 1,200 mg by mouth daily.  . fexofenadine (ALLEGRA) 180 MG tablet Take 180 mg by mouth daily.  . Omega-3 Fatty Acids (FISH OIL) 1000 MG CAPS Take 1,000 mg by mouth daily.  . [DISCONTINUED] ibuprofen (ADVIL,MOTRIN) 800 MG tablet Take 1 tablet (800 mg total) by mouth 2  (two) times daily as needed.  . [DISCONTINUED] valACYclovir (VALTREX) 500 MG tablet Take 1 tablet (500 mg total) by mouth daily.   No facility-administered encounter medications on file as of 04/07/2015.    Review of Systems  Constitutional: Negative for appetite change and unexpected weight change.  HENT: Negative for congestion and sinus pressure.   Eyes: Negative for pain and visual disturbance.  Respiratory: Negative for cough, chest tightness and shortness of breath.   Cardiovascular: Negative for chest pain, palpitations and leg swelling.  Gastrointestinal: Negative for nausea, vomiting, abdominal pain and diarrhea.  Genitourinary: Negative for dysuria and difficulty urinating.       No vaginal problems.   Musculoskeletal: Negative for back pain and joint swelling.  Skin: Negative for color change and rash.  Neurological: Negative for dizziness, light-headedness and headaches.  Hematological: Negative for adenopathy. Does not bruise/bleed easily.  Psychiatric/Behavioral: Negative for dysphoric mood and agitation.       Increased stress as outlined.        Objective:     Blood pressure rechecked by me:  122/76  Physical Exam  Constitutional: She is oriented to person, place, and time. She appears well-developed and well-nourished. No distress.  HENT:  Nose: Nose normal.  Mouth/Throat: Oropharynx is clear and moist.  Eyes: Right eye exhibits no discharge. Left eye exhibits no discharge. No scleral icterus.  Neck: Neck supple. No thyromegaly present.  Cardiovascular: Normal rate and regular rhythm.   Pulmonary/Chest: Breath sounds normal. No accessory muscle usage. No tachypnea. No respiratory distress. She has no decreased breath sounds. She has no wheezes. She has no rhonchi. Right breast exhibits no inverted nipple, no mass, no nipple discharge and no tenderness (no axillary adenopathy). Left breast exhibits no inverted nipple, no mass, no nipple discharge and no tenderness  (no axilarry adenopathy).  Abdominal: Soft. Bowel sounds are normal. There is no tenderness.  Musculoskeletal: She exhibits no edema or tenderness.  Lymphadenopathy:    She has no cervical adenopathy.  Neurological: She is alert and oriented to person, place, and time.  Skin: Skin is warm. No rash noted. No erythema.  Psychiatric: She has a normal mood and affect. Her behavior is normal.    BP 128/80 mmHg  Pulse 87  Temp(Src) 97.9 F (36.6 C) (Oral)  Resp 18  Ht 5\' 3"  (1.6 m)  Wt 113 lb 4 oz (51.37 kg)  BMI 20.07 kg/m2  SpO2 96% Wt Readings from Last 3 Encounters:  04/07/15 113 lb 4 oz (51.37 kg)  09/21/14 114 lb 8 oz (51.937 kg)  04/06/14 114 lb 12 oz (52.05 kg)     Lab Results  Component Value Date   WBC 3.9* 04/20/2014   HGB 13.6 04/20/2014   HCT 40.6 04/20/2014   PLT 315.0 04/20/2014   GLUCOSE 93 04/20/2014   CHOL 221* 04/20/2014   TRIG 63.0 04/20/2014   HDL 70.30 04/20/2014   LDLDIRECT 129.2 04/04/2013   LDLCALC 138* 04/20/2014   ALT 18 04/20/2014   AST 26 04/20/2014   NA 138 04/20/2014   K 4.2 04/20/2014   CL 104 04/20/2014   CREATININE 0.9 04/20/2014   BUN 16 04/20/2014   CO2 28 04/20/2014   TSH 3.69 04/20/2014       Assessment & Plan:   Problem List Items Addressed This Visit    Health care maintenance    Physical today 04/07/15.  PAP 03/24/13 negative with negative HPV.  Colonoscopy within the last couple of years - ok per her report.  Recommended f/u in 10 years.       Osteoporosis    Bone density as outlined in last note.  Wants to continue weight bearing exercise, calcium and vitamin D (and dietary calcium).  Will follow.  Check vitamin D level today.       Relevant Orders   Vit D  25 hydroxy (rtn osteoporosis monitoring)   Stress    Stress as outlined.  Feels she is handling things well.  Does not feel she needs any further intervention at this time.  Follow.       Relevant Orders   TSH   Comprehensive metabolic panel   CBC with  Differential/Platelet    Other Visit Diagnoses    Screening cholesterol level    -  Primary    Relevant Orders    Lipid panel        Dale [place], MD

## 2015-04-07 NOTE — Addendum Note (Signed)
Addended by: Montine CircleMALDONADO, Nailea Whitehorn D on: 04/07/2015 09:15 AM   Modules accepted: Orders

## 2015-04-07 NOTE — Assessment & Plan Note (Signed)
Bone density as outlined in last note.  Wants to continue weight bearing exercise, calcium and vitamin D (and dietary calcium).  Will follow.  Check vitamin D level today.

## 2015-04-07 NOTE — Progress Notes (Signed)
Pre-visit discussion using our clinic review tool. No additional management support is needed unless otherwise documented below in the visit note.  

## 2015-04-07 NOTE — Assessment & Plan Note (Signed)
Stress as outlined.  Feels she is handling things well.  Does not feel she needs any further intervention at this time.  Follow.

## 2015-04-08 ENCOUNTER — Encounter: Payer: Self-pay | Admitting: *Deleted

## 2015-04-08 ENCOUNTER — Telehealth: Payer: Self-pay | Admitting: *Deleted

## 2015-04-08 NOTE — Telephone Encounter (Signed)
Hey Do you know what this is about?

## 2015-04-08 NOTE — Telephone Encounter (Signed)
VHQ:[id number]:Patient stated the name of facility that the colonoscopy was done was, [facility]. Phone [phone], fax [phone].

## 2015-04-08 NOTE — Telephone Encounter (Signed)
She completed a medical release form yesterday & she gave it to the front desk upon checkout. Can you check with them. She was calling back today with the name of the office that I need to request records from. I checked my box up front & it is not there.  Thank you

## 2015-04-19 ENCOUNTER — Encounter: Payer: Self-pay | Admitting: Internal Medicine

## 2015-04-30 ENCOUNTER — Encounter: Payer: Self-pay | Admitting: Internal Medicine

## 2015-05-03 LAB — HM MAMMOGRAPHY

## 2015-05-05 ENCOUNTER — Encounter: Payer: Self-pay | Admitting: Internal Medicine

## 2015-05-14 ENCOUNTER — Other Ambulatory Visit: Payer: Self-pay | Admitting: *Deleted

## 2015-06-03 ENCOUNTER — Telehealth: Payer: Self-pay | Admitting: *Deleted

## 2015-06-03 NOTE — Telephone Encounter (Signed)
LMTCB-I was calling to confirm that she has been scheduled for a 6 month f/u right mammogram (at Dr. Roby LoftsScott's request)-holding records at my desk until I hear back from patient.

## 2015-06-19 ENCOUNTER — Encounter: Payer: Self-pay | Admitting: *Deleted

## 2015-06-19 NOTE — Telephone Encounter (Signed)
Pt never returned my call. Mailed letter requesting a response

## 2015-07-05 ENCOUNTER — Telehealth: Payer: Self-pay

## 2015-07-05 NOTE — Telephone Encounter (Signed)
Pt states that she need an order for a new mammogram. Pt states that her last mamogram showed calcification and she needs a followup mammogram. Pt gets mammograms done at [facility] in [place]. Please advise

## 2015-07-05 NOTE — Telephone Encounter (Signed)
Spoke with Ms Dlugosz and had recent mammogram faxed over

## 2015-07-05 NOTE — Telephone Encounter (Signed)
LMOMTCB

## 2015-07-05 NOTE — Telephone Encounter (Signed)
I do not mind placing the order, but I have not received the report from the mammogram - that states what f/u view is needed.  Need the report asap, so that I can place the order.  Thanks.

## 2015-07-07 ENCOUNTER — Other Ambulatory Visit: Payer: Self-pay | Admitting: Internal Medicine

## 2015-07-07 DIAGNOSIS — R928 Other abnormal and inconclusive findings on diagnostic imaging of breast: Secondary | ICD-10-CM

## 2015-07-07 NOTE — Progress Notes (Signed)
Order placed for ultrasound right breast

## 2015-07-07 NOTE — Progress Notes (Signed)
Order placed for f/u mammogram - right breast.  6 month (last 04/2015)

## 2015-07-09 ENCOUNTER — Telehealth: Payer: Self-pay | Admitting: Internal Medicine

## 2015-07-09 NOTE — Telephone Encounter (Signed)
LMTCB to reschedule AWV to another day/msn

## 2015-07-16 ENCOUNTER — Ambulatory Visit: Payer: BC Managed Care – PPO

## 2015-11-15 LAB — HM MAMMOGRAPHY

## 2015-11-17 ENCOUNTER — Encounter: Payer: Self-pay | Admitting: Internal Medicine

## 2016-04-10 ENCOUNTER — Ambulatory Visit (INDEPENDENT_AMBULATORY_CARE_PROVIDER_SITE_OTHER): Payer: BC Managed Care – PPO | Admitting: Internal Medicine

## 2016-04-10 ENCOUNTER — Encounter: Payer: Self-pay | Admitting: Internal Medicine

## 2016-04-10 ENCOUNTER — Other Ambulatory Visit (HOSPITAL_COMMUNITY)
Admission: RE | Admit: 2016-04-10 | Discharge: 2016-04-10 | Disposition: A | Discharge status: Home / Self Care | Payer: BC Managed Care – PPO | Source: Ambulatory Visit | Attending: Internal Medicine | Admitting: Internal Medicine

## 2016-04-10 VITALS — BP 128/84 | HR 75 | Temp 98.4°F | Ht 63.0 in | Wt 120.8 lb

## 2016-04-10 DIAGNOSIS — Z23 Encounter for immunization: Secondary | ICD-10-CM

## 2016-04-10 DIAGNOSIS — Z0000 Encounter for general adult medical examination without abnormal findings: Secondary | ICD-10-CM | POA: Diagnosis not present

## 2016-04-10 DIAGNOSIS — Z124 Encounter for screening for malignant neoplasm of cervix: Secondary | ICD-10-CM | POA: Diagnosis not present

## 2016-04-10 DIAGNOSIS — Z1151 Encounter for screening for human papillomavirus (HPV): Secondary | ICD-10-CM | POA: Diagnosis present

## 2016-04-10 DIAGNOSIS — R350 Frequency of micturition: Secondary | ICD-10-CM

## 2016-04-10 DIAGNOSIS — M810 Age-related osteoporosis without current pathological fracture: Secondary | ICD-10-CM | POA: Diagnosis not present

## 2016-04-10 DIAGNOSIS — Z01419 Encounter for gynecological examination (general) (routine) without abnormal findings: Secondary | ICD-10-CM | POA: Diagnosis present

## 2016-04-10 DIAGNOSIS — Z13220 Encounter for screening for lipoid disorders: Secondary | ICD-10-CM | POA: Diagnosis not present

## 2016-04-10 DIAGNOSIS — F439 Reaction to severe stress, unspecified: Secondary | ICD-10-CM

## 2016-04-10 LAB — COMPREHENSIVE METABOLIC PANEL
ALBUMIN: 4.1 g/dL (ref 3.5–5.2)
ALT: 18 U/L (ref 0–35)
AST: 24 U/L (ref 0–37)
Alkaline Phosphatase: 83 U/L (ref 39–117)
BUN: 16 mg/dL (ref 6–23)
CALCIUM: 9.6 mg/dL (ref 8.4–10.5)
CHLORIDE: 103 meq/L (ref 96–112)
CO2: 31 meq/L (ref 19–32)
Creatinine, Ser: 0.81 mg/dL (ref 0.40–1.20)
GFR: 78.06 mL/min (ref >60.00)
Glucose, Bld: 89 mg/dL (ref 70–99)
POTASSIUM: 4.2 meq/L (ref 3.5–5.1)
SODIUM: 139 meq/L (ref 135–145)
Total Bilirubin: 0.5 mg/dL (ref 0.2–1.2)
Total Protein: 7.2 g/dL (ref 6.0–8.3)

## 2016-04-10 LAB — LIPID PANEL
CHOLESTEROL: 232 mg/dL — AB (ref 0–200)
HDL: 70.4 mg/dL (ref >39.00)
LDL CALC: 140 mg/dL — AB (ref 0–99)
NonHDL: 161.48
Total CHOL/HDL Ratio: 3
Triglycerides: 105 mg/dL (ref 0.0–149.0)
VLDL: 21 mg/dL (ref 0.0–40.0)

## 2016-04-10 LAB — CBC WITH DIFFERENTIAL/PLATELET
BASOS PCT: 0.4 % (ref 0.0–3.0)
Basophils Absolute: 0 10*3/uL (ref 0.0–0.1)
EOS ABS: 0.1 10*3/uL (ref 0.0–0.7)
EOS PCT: 3 % (ref 0.0–5.0)
HEMATOCRIT: 40.5 % (ref 36.0–46.0)
HEMOGLOBIN: 13.9 g/dL (ref 12.0–15.0)
Lymphocytes Relative: 33.8 % (ref 12.0–46.0)
Lymphs Abs: 1.6 10*3/uL (ref 0.7–4.0)
MCHC: 34.2 g/dL (ref 30.0–36.0)
MCV: 90.3 fl (ref 78.0–100.0)
MONO ABS: 0.5 10*3/uL (ref 0.1–1.0)
Monocytes Relative: 10.5 % (ref 3.0–12.0)
NEUTROS ABS: 2.4 10*3/uL (ref 1.4–7.7)
Neutrophils Relative %: 52.3 % (ref 43.0–77.0)
PLATELETS: 342 10*3/uL (ref 150.0–400.0)
RBC: 4.49 Mil/uL (ref 3.87–5.11)
RDW: 13 % (ref 11.5–15.5)
WBC: 4.7 10*3/uL (ref 4.0–10.5)

## 2016-04-10 LAB — VITAMIN D 25 HYDROXY (VIT D DEFICIENCY, FRACTURES): VITD: 36.41 ng/mL (ref 30.00–100.00)

## 2016-04-10 LAB — TSH: TSH: 3.79 u[IU]/mL (ref 0.35–4.50)

## 2016-04-10 NOTE — Progress Notes (Signed)
Patient ID: Whitney Guerrero, female   DOB: Jan 12, 1961, 55 y.o.   MRN: [id number]   Subjective:    Patient ID: Whitney Guerrero, female    DOB: 10/26/1960, 55 y.o.   MRN: [id number]  HPI  Patient here for her physical exam.  She reports she is doing relatively well.  Some increased stress with her son and daughter, but feels she is handling things relatively well.  She is planning to move to [place].  They are building a house.  She is taking care of her granddaughter. Stays active.  Works on her farm.  No chest pain.  No sob. No acid reflux.  No abdominal pain or cramping.  Bowels stable.  She is taking calcium with vitamin D.  Does report some increased urinary frequency.  No dysuria.  Desires no intervention.  Previous left shoulder pain.  Saw a Land.  Resolved.     Past Medical History:  Diagnosis Date  . Osteoporosis    Past Surgical History:  Procedure Laterality Date  . BREAST BIOPSY    . TONSILLECTOMY AND ADENOIDECTOMY     Family History  Problem Relation Age of Onset  . Heart disease Father   . Colon cancer Maternal Uncle   . Colon cancer Cousin   . Breast cancer Neg Hx    Social History   Social History  . Marital status: Married    Spouse name: N/A  . Number of children: N/A  . Years of education: N/A   Social History Main Topics  . Smoking status: Never Smoker  . Smokeless tobacco: Never Used  . Alcohol use 0.0 oz/week     Comment: one glass of wine every other night  . Drug use: No  . Sexual activity: Not Asked   Other Topics Concern  . None   Social History Narrative  . None    Outpatient Encounter Prescriptions as of 04/10/2016  Medication Sig  . Calcium Carbonate-Vit D-Min 1200-1000 MG-UNIT CHEW Chew 1,200 mg by mouth daily.  . [DISCONTINUED] fexofenadine (ALLEGRA) 180 MG tablet Take 180 mg by mouth daily.  . [DISCONTINUED] Omega-3 Fatty Acids (FISH OIL) 1000 MG CAPS Take 1,000 mg by mouth daily.   No facility-administered encounter medications  on file as of 04/10/2016.     Review of Systems  Constitutional: Negative for appetite change and unexpected weight change.  HENT: Negative for congestion and sinus pressure.   Eyes: Negative for pain and visual disturbance.  Respiratory: Negative for cough, chest tightness and shortness of breath.   Cardiovascular: Negative for chest pain, palpitations and leg swelling.  Gastrointestinal: Negative for abdominal pain, diarrhea, nausea and vomiting.  Genitourinary: Positive for frequency. Negative for difficulty urinating and dysuria.  Musculoskeletal: Negative for back pain and joint swelling.  Skin: Negative for color change and rash.  Neurological: Negative for dizziness, light-headedness and headaches.  Hematological: Negative for adenopathy. Does not bruise/bleed easily.  Psychiatric/Behavioral: Negative for agitation and dysphoric mood.       Objective:     Blood pressure rechecked by me:  120/82  Physical Exam  Constitutional: She is oriented to person, place, and time. She appears well-developed and well-nourished. No distress.  HENT:  Nose: Nose normal.  Mouth/Throat: Oropharynx is clear and moist.  Eyes: Right eye exhibits no discharge. Left eye exhibits no discharge. No scleral icterus.  Neck: Neck supple. No thyromegaly present.  Cardiovascular: Normal rate and regular rhythm.   Pulmonary/Chest: Breath sounds normal. No accessory muscle usage. No tachypnea.  No respiratory distress. She has no decreased breath sounds. She has no wheezes. She has no rhonchi. Right breast exhibits no inverted nipple, no mass, no nipple discharge and no tenderness (no axillary adenopathy). Left breast exhibits no inverted nipple, no mass, no nipple discharge and no tenderness (no axilarry adenopathy).  Abdominal: Soft. Bowel sounds are normal. There is no tenderness.  Genitourinary:  Genitourinary Comments: Normal external genitalia.  Vaginal vault without lesions.  Cervix identified.  Pap  smear performed.  Could not appreciate any adnexal masses or tenderness.    Musculoskeletal: She exhibits no edema or tenderness.  Lymphadenopathy:    She has no cervical adenopathy.  Neurological: She is alert and oriented to person, place, and time.  Skin: Skin is warm. No rash noted.  Psychiatric: She has a normal mood and affect. Her behavior is normal.    BP 128/84   Pulse 75   Temp 98.4 F (36.9 C) (Oral)   Ht 5\' 3"  (1.6 m)   Wt 120 lb 12.8 oz (54.8 kg)   SpO2 98%   BMI 21.40 kg/m  Wt Readings from Last 3 Encounters:  04/10/16 120 lb 12.8 oz (54.8 kg)  04/07/15 113 lb 4 oz (51.4 kg)  09/21/14 114 lb 8 oz (51.9 kg)     Lab Results  Component Value Date   WBC 4.7 04/10/2016   HGB 13.9 04/10/2016   HCT 40.5 04/10/2016   PLT 342.0 04/10/2016   GLUCOSE 89 04/10/2016   CHOL 232 (H) 04/10/2016   TRIG 105.0 04/10/2016   HDL 70.40 04/10/2016   LDLDIRECT 129.2 04/04/2013   LDLCALC 140 (H) 04/10/2016   ALT 18 04/10/2016   AST 24 04/10/2016   NA 139 04/10/2016   K 4.2 04/10/2016   CL 103 04/10/2016   CREATININE 0.81 04/10/2016   BUN 16 04/10/2016   CO2 31 04/10/2016   TSH 3.79 04/10/2016       Assessment & Plan:   Problem List Items Addressed This Visit    Health care maintenance    Physical today 04/10/16.  PAP 04/10/16.  Colonoscopy a few years ago.  Recommended f/u in 10 years.  Right breast f/u mammogram 11/15/15 - Birads III.  Recommended f/u bilateral mammogram in 6 months.  Due next month.  She will call to confirm appt.  Let us know if problems.        Osteoporosis    Recheck vitamin D level.  Taking calcium with vitamin D.        Relevant Orders   CBC with Differential/Platelet (Completed)   Comprehensive metabolic panel (Completed)   TSH (Completed)   VITAMIN D 25 Hydroxy (Vit-D Deficiency, Fractures) (Completed)   Stress    Increased stress as outlined.  She feels she is doing relatively well.  Follow.         Other Visit Diagnoses    Routine  cervical smear    -  Primary   Relevant Orders   Cytology - PAP   Screening cholesterol level       Relevant Orders   Lipid panel (Completed)   Encounter for immunization       Relevant Orders   Flu Vaccine QUAD 36+ mos IM (Completed)   Urinary frequency       She declines any further intervention.  follow.        Dale Andrews, MD

## 2016-04-10 NOTE — Assessment & Plan Note (Signed)
Recheck vitamin D level.  Taking calcium with vitamin D.

## 2016-04-10 NOTE — Progress Notes (Signed)
Pre visit review using our clinic review tool, if applicable. No additional management support is needed unless otherwise documented below in the visit note. 

## 2016-04-10 NOTE — Assessment & Plan Note (Signed)
Increased stress as outlined.  She feels she is doing relatively well.  Follow.

## 2016-04-10 NOTE — Assessment & Plan Note (Addendum)
Physical today 04/10/16.  PAP 04/10/16.  Colonoscopy a few years ago.  Recommended f/u in 10 years.  Right breast f/u mammogram 11/15/15 - Birads III.  Recommended f/u bilateral mammogram in 6 months.  Due next month.  She will call to confirm appt.  Let us know if problems.

## 2016-04-11 ENCOUNTER — Encounter: Payer: Self-pay | Admitting: Internal Medicine

## 2016-04-11 LAB — CYTOLOGY - PAP
Diagnosis: NEGATIVE
HPV: NOT DETECTED

## 2016-04-12 ENCOUNTER — Encounter: Payer: Self-pay | Admitting: Internal Medicine

## 2016-05-09 ENCOUNTER — Encounter: Payer: Self-pay | Admitting: Internal Medicine

## 2016-05-09 DIAGNOSIS — R928 Other abnormal and inconclusive findings on diagnostic imaging of breast: Secondary | ICD-10-CM

## 2016-05-10 NOTE — Telephone Encounter (Signed)
I have placed the order for the mammogram and ultrasound.  She sent my a my chart message to get these scheduled.  Wants at [facility].  Thanks

## 2016-05-18 ENCOUNTER — Encounter: Payer: Self-pay | Admitting: Internal Medicine

## 2016-06-06 ENCOUNTER — Encounter: Payer: Self-pay | Admitting: Internal Medicine

## 2016-08-29 ENCOUNTER — Encounter: Payer: Self-pay | Admitting: Internal Medicine

## 2016-08-30 NOTE — Telephone Encounter (Signed)
Pt returned your call.      Thank you!

## 2016-08-30 NOTE — Telephone Encounter (Signed)
Left message to return call to our office. Need to get more information from her about problem.

## 2016-08-31 NOTE — Telephone Encounter (Signed)
Ok to schedule at 1:00 Wednesday 09/06/16.

## 2016-08-31 NOTE — Telephone Encounter (Signed)
I can see her tomorrow at 8:00

## 2016-08-31 NOTE — Telephone Encounter (Signed)
She returned call states that she is having tight feeling in outside right breast for 4-5 days. I know that you want to see her next week just not sure where I can put her. No discharge or lump felt by patient on self exam.

## 2016-09-01 NOTE — Telephone Encounter (Signed)
Patient confirmed appointment.

## 2016-09-06 ENCOUNTER — Encounter: Payer: Self-pay | Admitting: Internal Medicine

## 2016-09-06 ENCOUNTER — Ambulatory Visit (INDEPENDENT_AMBULATORY_CARE_PROVIDER_SITE_OTHER): Payer: BC Managed Care – PPO | Admitting: Internal Medicine

## 2016-09-06 DIAGNOSIS — F439 Reaction to severe stress, unspecified: Secondary | ICD-10-CM | POA: Diagnosis not present

## 2016-09-06 DIAGNOSIS — N644 Mastodynia: Secondary | ICD-10-CM

## 2016-09-06 NOTE — Progress Notes (Signed)
Pre visit review using our clinic review tool, if applicable. No additional management support is needed unless otherwise documented below in the visit note. 

## 2016-09-06 NOTE — Progress Notes (Signed)
Patient ID: Whitney Guerrero, female   DOB: 1960/12/24, 56 y.o.   MRN: [id number]   Subjective:    Patient ID: Whitney Guerrero, female    DOB: [birth year], 56 y.o.   MRN: [id number]  HPI  Patient here as a work in with concerns regarding pain in right breast/axilla.  She denies any trauma or injury.  Has been doing some painting.  States pain has been present over the last two weeks.  No nipple discharge.  No nipple retraction.  No lump palpated.  Was concerned over persistent pain.  Increased stress with her daughter's medical and social issues.  Discussed with her today.  She does not feel needs any further intervention at this time.     Past Medical History:  Diagnosis Date  . Osteoporosis    Past Surgical History:  Procedure Laterality Date  . BREAST BIOPSY    . TONSILLECTOMY AND ADENOIDECTOMY     Family History  Problem Relation Age of Onset  . Heart disease Father   . Colon cancer Maternal Uncle   . Colon cancer Cousin   . Breast cancer Neg Hx    Social History   Social History  . Marital status: Married    Spouse name: N/A  . Number of children: N/A  . Years of education: N/A   Social History Main Topics  . Smoking status: Never Smoker  . Smokeless tobacco: Never Used  . Alcohol use 0.0 oz/week     Comment: one glass of wine every other night  . Drug use: No  . Sexual activity: Not Asked   Other Topics Concern  . None   Social History Narrative  . None    Outpatient Encounter Prescriptions as of 09/06/2016  Medication Sig  . Calcium Carbonate-Vit D-Min 1200-1000 MG-UNIT CHEW Chew 1,200 mg by mouth daily.  Marland Kitchen loratadine (CLARITIN) 10 MG tablet Take 10 mg by mouth daily.  . vitamin C (ASCORBIC ACID) 500 MG tablet Take 500 mg by mouth daily.   No facility-administered encounter medications on file as of 09/06/2016.     Review of Systems  Constitutional: Negative for appetite change.  Respiratory: Negative for cough, chest tightness and shortness of breath.     Cardiovascular: Negative for chest pain and leg swelling.       Breast tenderness as outlined.    Gastrointestinal: Negative for abdominal pain, nausea and vomiting.  Musculoskeletal: Negative for back pain and joint swelling.  Skin: Negative for color change and rash.  Psychiatric/Behavioral: Negative for agitation and dysphoric mood.       Objective:    Physical Exam  Constitutional: She appears well-developed and well-nourished. No distress.  Neck: Neck supple.  Cardiovascular: Normal rate and regular rhythm.   Pulmonary/Chest: Breath sounds normal. No respiratory distress. She has no wheezes.  Breasts - no nipple discharge or nipple retraction present.  Could not appreciated distinct nodules.  Some pain - right axilla and 1-2:00 region of right breast.  Left breast - no pain and no changes noted.   Abdominal: Soft. Bowel sounds are normal. There is no tenderness.  Lymphadenopathy:    She has no cervical adenopathy.  Skin: No rash noted. No erythema.  Psychiatric: She has a normal mood and affect. Her behavior is normal.    BP 116/90 (BP Location: Left Arm, Patient Position: Sitting, Cuff Size: Normal)   Pulse 85   Temp 98.3 F (36.8 C) (Oral)   Wt 115 lb 6 oz (52.3 kg)  SpO2 98%   BMI 20.44 kg/m  Wt Readings from Last 3 Encounters:  09/06/16 115 lb 6 oz (52.3 kg)  04/10/16 120 lb 12.8 oz (54.8 kg)  04/07/15 113 lb 4 oz (51.4 kg)     Lab Results  Component Value Date   WBC 4.7 04/10/2016   HGB 13.9 04/10/2016   HCT 40.5 04/10/2016   PLT 342.0 04/10/2016   GLUCOSE 89 04/10/2016   CHOL 232 (H) 04/10/2016   TRIG 105.0 04/10/2016   HDL 70.40 04/10/2016   LDLDIRECT 129.2 04/04/2013   LDLCALC 140 (H) 04/10/2016   ALT 18 04/10/2016   AST 24 04/10/2016   NA 139 04/10/2016   K 4.2 04/10/2016   CL 103 04/10/2016   CREATININE 0.81 04/10/2016   BUN 16 04/10/2016   CO2 31 04/10/2016   TSH 3.79 04/10/2016       Assessment & Plan:   Problem List Items Addressed  This Visit    Breast pain, right    Persistent pain as outlined.  Exam as outlined.  Schedule diagnostic right breast mammogram with ultrasound.  Tylenol for pain.        Relevant Orders   MM Digital Diagnostic Unilat R   US BREAST LTD UNI RIGHT INC AXILLA   Stress    Increased stress as outlined.  Discussed with her today.  Desires no further intervention at this time.  Follow.            Dale [place], MD

## 2016-09-10 ENCOUNTER — Encounter: Payer: Self-pay | Admitting: Internal Medicine

## 2016-09-10 DIAGNOSIS — N644 Mastodynia: Secondary | ICD-10-CM | POA: Insufficient documentation

## 2016-09-10 NOTE — Assessment & Plan Note (Addendum)
Persistent pain as outlined.  Exam as outlined.  Schedule diagnostic right breast mammogram with ultrasound.  Tylenol for pain.

## 2016-09-10 NOTE — Assessment & Plan Note (Signed)
Increased stress as outlined.  Discussed with her today.  Desires no further intervention at this time.  Follow.   

## 2016-09-25 LAB — HM MAMMOGRAPHY

## 2017-02-19 ENCOUNTER — Telehealth: Payer: Self-pay

## 2017-02-19 NOTE — Telephone Encounter (Signed)
Per note I have called and made appointment for patients f/u mammogram on 10/18 at 9:45. App made at [facility]. I have called and gave information to patient. She did state that she has moved to [place] and will need to move app to Friday. She will call for another appointment if any problems she will call our office. Report placed back in your blue folder.

## 2017-02-20 NOTE — Telephone Encounter (Signed)
Noted  

## 2017-04-09 ENCOUNTER — Encounter: Payer: Self-pay | Admitting: Internal Medicine

## 2017-04-13 ENCOUNTER — Encounter: Payer: Self-pay | Admitting: Internal Medicine

## 2017-04-13 ENCOUNTER — Ambulatory Visit (INDEPENDENT_AMBULATORY_CARE_PROVIDER_SITE_OTHER): Payer: BC Managed Care – PPO | Admitting: Internal Medicine

## 2017-04-13 VITALS — BP 116/88 | HR 85 | Temp 98.4°F | Wt 115.2 lb

## 2017-04-13 DIAGNOSIS — E7800 Pure hypercholesterolemia, unspecified: Secondary | ICD-10-CM | POA: Diagnosis not present

## 2017-04-13 DIAGNOSIS — F439 Reaction to severe stress, unspecified: Secondary | ICD-10-CM | POA: Diagnosis not present

## 2017-04-13 DIAGNOSIS — Z0000 Encounter for general adult medical examination without abnormal findings: Secondary | ICD-10-CM

## 2017-04-13 NOTE — Progress Notes (Signed)
Patient ID: Whitney Guerrero, female   DOB: [birth year], 56 y.o.   MRN: [id number]   Subjective:    Patient ID: Whitney Guerrero, female    DOB: [birth year], 56 y.o.   MRN: [id number]  HPI  Patient here for her physical exam.  She reports she is doing well.  Feels good.  Staying active.  Feels this is why she has lost weight.  Eating well.  No chest pain.  No sob.  No acid reflux.  No abdominal pain.  Bowels moving.  No urine change.  Stress is better.  Her daughter is doing better.     Past Medical History:  Diagnosis Date  . Osteoporosis    Past Surgical History:  Procedure Laterality Date  . BREAST BIOPSY    . TONSILLECTOMY AND ADENOIDECTOMY     Family History  Problem Relation Age of Onset  . Heart disease Father   . Colon cancer Maternal Uncle   . Colon cancer Cousin   . Breast cancer Neg Hx    Social History   Socioeconomic History  . Marital status: Married    Spouse name: None  . Number of children: None  . Years of education: None  . Highest education level: None  Social Needs  . Financial resource strain: None  . Food insecurity - worry: None  . Food insecurity - inability: None  . Transportation needs - medical: None  . Transportation needs - non-medical: None  Occupational History  . None  Tobacco Use  . Smoking status: Never Smoker  . Smokeless tobacco: Never Used  Substance and Sexual Activity  . Alcohol use: Yes    Alcohol/week: 0.0 oz    Comment: one glass of wine every other night  . Drug use: No  . Sexual activity: None  Other Topics Concern  . None  Social History Narrative  . None    Outpatient Encounter Medications as of 04/13/2017  Medication Sig  . Calcium Carbonate-Vit D-Min 1200-1000 MG-UNIT CHEW Chew 1,200 mg by mouth daily.  Marland Kitchen loratadine (CLARITIN) 10 MG tablet Take 10 mg by mouth daily.  . vitamin C (ASCORBIC ACID) 500 MG tablet Take 500 mg by mouth daily.   No facility-administered encounter medications on file as of 04/13/2017.      Review of Systems  Constitutional: Negative for appetite change and fever.  HENT: Negative for congestion and sinus pressure.   Eyes: Negative for pain and visual disturbance.  Respiratory: Negative for cough, chest tightness and shortness of breath.   Cardiovascular: Negative for chest pain, palpitations and leg swelling.  Gastrointestinal: Negative for abdominal pain, diarrhea, nausea and vomiting.  Genitourinary: Negative for difficulty urinating and dysuria.  Musculoskeletal: Negative for back pain and joint swelling.  Skin: Negative for color change and rash.  Neurological: Negative for dizziness, light-headedness and headaches.  Hematological: Negative for adenopathy. Does not bruise/bleed easily.  Psychiatric/Behavioral: Negative for agitation and dysphoric mood.       Objective:    Physical Exam  Constitutional: She is oriented to person, place, and time. She appears well-developed and well-nourished. No distress.  HENT:  Nose: Nose normal.  Mouth/Throat: Oropharynx is clear and moist.  Eyes: Right eye exhibits no discharge. Left eye exhibits no discharge. No scleral icterus.  Neck: Neck supple. No thyromegaly present.  Cardiovascular: Normal rate and regular rhythm.  Pulmonary/Chest: Breath sounds normal. No accessory muscle usage. No tachypnea. No respiratory distress. She has no decreased breath sounds. She has no wheezes. She has  no rhonchi. Right breast exhibits no inverted nipple, no mass, no nipple discharge and no tenderness (no axillary adenopathy). Left breast exhibits no inverted nipple, no mass, no nipple discharge and no tenderness (no axilarry adenopathy).  Abdominal: Soft. Bowel sounds are normal. There is no tenderness.  Musculoskeletal: She exhibits no edema or tenderness.  Lymphadenopathy:    She has no cervical adenopathy.  Neurological: She is alert and oriented to person, place, and time.  Skin: Skin is warm. No rash noted. No erythema.   Psychiatric: She has a normal mood and affect. Her behavior is normal.    BP 116/88   Pulse 85   Temp 98.4 F (36.9 C)   Wt 115 lb 3.2 oz (52.3 kg)   SpO2 97%   BMI 20.41 kg/m  Wt Readings from Last 3 Encounters:  04/15/17 115 lb 3.2 oz (52.3 kg)  09/06/16 115 lb 6 oz (52.3 kg)  04/10/16 120 lb 12.8 oz (54.8 kg)     Lab Results  Component Value Date   WBC 4.7 04/10/2016   HGB 13.9 04/10/2016   HCT 40.5 04/10/2016   PLT 342.0 04/10/2016   GLUCOSE 89 04/10/2016   CHOL 213 (H) 04/13/2017   TRIG 77 04/13/2017   HDL 85 04/13/2017   LDLDIRECT 129.2 04/04/2013   LDLCALC 140 (H) 04/10/2016   ALT 18 04/10/2016   AST 24 04/10/2016   NA 139 04/10/2016   K 4.2 04/10/2016   CL 103 04/10/2016   CREATININE 0.81 04/10/2016   BUN 16 04/10/2016   CO2 31 04/10/2016   TSH 3.79 04/10/2016       Assessment & Plan:   Problem List Items Addressed This Visit    Health care maintenance    Physical today 04/13/17.  PAP 04/10/16 - negative with negative HPV.   Mammogram 04/09/17 - birads II.  Recommended f/u mammogram in one year.  Had colonoscopy.  Need results.  Had at age 59.  States was told due in 10 years.        Stress    Daughter is doing better.  Stress is better.  Follow.  Feels better.        Other Visit Diagnoses    Routine general medical examination at a health care facility    -  Primary   Hypercholesteremia       Relevant Orders   Lipid panel (Completed)       Dale Kenbridge, MD

## 2017-04-13 NOTE — Assessment & Plan Note (Addendum)
Physical today 04/13/17.  PAP 04/10/16 - negative with negative HPV.   Mammogram 04/09/17 - birads II.  Recommended f/u mammogram in one year.  Had colonoscopy.  Need results.  Had at age 56.  States was told due in 10 years.

## 2017-04-14 LAB — LIPID PANEL
CHOL/HDL RATIO: 2.5 (calc) (ref <5.0)
Cholesterol: 213 mg/dL — ABNORMAL HIGH (ref <200)
HDL: 85 mg/dL (ref >50)
LDL CHOLESTEROL (CALC): 111 mg/dL — AB
Non-HDL Cholesterol (Calc): 128 mg/dL (calc) (ref <130)
Triglycerides: 77 mg/dL (ref <150)

## 2017-04-15 ENCOUNTER — Encounter: Payer: Self-pay | Admitting: Internal Medicine

## 2017-04-15 NOTE — Assessment & Plan Note (Signed)
Daughter is doing better.  Stress is better.  Follow.  Feels better.

## 2017-04-30 ENCOUNTER — Encounter: Payer: Self-pay | Admitting: Internal Medicine

## 2017-05-27 ENCOUNTER — Encounter: Payer: Self-pay | Admitting: Internal Medicine

## 2017-08-14 ENCOUNTER — Encounter: Payer: Self-pay | Admitting: Internal Medicine

## 2017-08-29 ENCOUNTER — Telehealth: Payer: Self-pay

## 2017-08-29 NOTE — Telephone Encounter (Signed)
Please advise 

## 2017-08-29 NOTE — Telephone Encounter (Signed)
Copied from CRM [id number]. Topic: General - Other >> Aug 29, 2017 11:52 AM Whitney Guerrero, [name] wrote: Reason for CRM: pt has resch her appt to July 2019. Pt would like to know if this appt is necessary. [place]

## 2017-08-30 NOTE — Telephone Encounter (Signed)
Is she doing ok?  Any problems?  Is her weight stable?  She was losing weight previously.  If weight is doing well and she is not having any issues, I am ok to keep her on her yearly physical schedule.  Will need cpe in 03/2018.

## 2017-08-31 NOTE — Telephone Encounter (Signed)
Patient stated she is doing well and weight is stable, patient advised ok to keep yearly 11/19, patient thanked PCP very much.

## 2017-09-02 NOTE — Telephone Encounter (Signed)
Need to cancel 11/2017 appt and schedule cpe in 03/2018.  Thanks

## 2017-09-03 NOTE — Telephone Encounter (Signed)
Cancelled 11/2017 appt and scheduled cpe in nov

## 2017-10-15 ENCOUNTER — Ambulatory Visit: Payer: BC Managed Care – PPO | Admitting: Internal Medicine

## 2017-10-26 ENCOUNTER — Encounter: Payer: Self-pay | Admitting: Internal Medicine

## 2017-10-31 ENCOUNTER — Telehealth: Payer: Self-pay

## 2017-10-31 NOTE — Telephone Encounter (Signed)
Patient scheduled as we discussed.

## 2017-10-31 NOTE — Telephone Encounter (Signed)
Copied from CRM [phone]. Topic: Appointment Scheduling - Scheduling Inquiry for Clinic >> Oct 31, 2017  7:55 AM Jay SchlichterWeikart, Melissa J wrote: Reason for CRM: pt called, she would like a shingles vaccine.  She would like to have a mon am appt dues to where she lives if this is possible.  Cb is [phone]

## 2017-11-05 ENCOUNTER — Ambulatory Visit (INDEPENDENT_AMBULATORY_CARE_PROVIDER_SITE_OTHER): Payer: BC Managed Care – PPO

## 2017-11-05 ENCOUNTER — Ambulatory Visit: Payer: Self-pay

## 2017-11-05 DIAGNOSIS — Z23 Encounter for immunization: Secondary | ICD-10-CM | POA: Diagnosis not present

## 2017-11-05 NOTE — Progress Notes (Signed)
Patient came in today for Shingrix vaccine. Administered in L deltoid. Tolerated well. NO complaints or concerns.

## 2017-12-24 ENCOUNTER — Ambulatory Visit: Payer: BC Managed Care – PPO | Admitting: Internal Medicine

## 2018-04-16 ENCOUNTER — Encounter: Payer: BC Managed Care – PPO | Admitting: Internal Medicine

## 2018-04-29 ENCOUNTER — Encounter: Payer: Self-pay | Admitting: Internal Medicine

## 2018-04-29 ENCOUNTER — Ambulatory Visit (INDEPENDENT_AMBULATORY_CARE_PROVIDER_SITE_OTHER): Payer: BC Managed Care – PPO | Admitting: Internal Medicine

## 2018-04-29 VITALS — BP 118/78 | HR 85 | Temp 98.1°F | Resp 16 | Ht 63.0 in | Wt 118.0 lb

## 2018-04-29 DIAGNOSIS — M810 Age-related osteoporosis without current pathological fracture: Secondary | ICD-10-CM | POA: Diagnosis not present

## 2018-04-29 DIAGNOSIS — Z0000 Encounter for general adult medical examination without abnormal findings: Secondary | ICD-10-CM

## 2018-04-29 DIAGNOSIS — Z13220 Encounter for screening for lipoid disorders: Secondary | ICD-10-CM | POA: Diagnosis not present

## 2018-04-29 DIAGNOSIS — Z23 Encounter for immunization: Secondary | ICD-10-CM | POA: Diagnosis not present

## 2018-04-29 LAB — CBC WITH DIFFERENTIAL/PLATELET
BASOS PCT: 0.5 % (ref 0.0–3.0)
Basophils Absolute: 0 10*3/uL (ref 0.0–0.1)
EOS PCT: 2.5 % (ref 0.0–5.0)
Eosinophils Absolute: 0.1 10*3/uL (ref 0.0–0.7)
HCT: 41.5 % (ref 36.0–46.0)
Hemoglobin: 14.3 g/dL (ref 12.0–15.0)
LYMPHS ABS: 1.6 10*3/uL (ref 0.7–4.0)
Lymphocytes Relative: 35.8 % (ref 12.0–46.0)
MCHC: 34.4 g/dL (ref 30.0–36.0)
MCV: 92 fl (ref 78.0–100.0)
MONO ABS: 0.5 10*3/uL (ref 0.1–1.0)
Monocytes Relative: 10.9 % (ref 3.0–12.0)
NEUTROS ABS: 2.3 10*3/uL (ref 1.4–7.7)
NEUTROS PCT: 50.3 % (ref 43.0–77.0)
PLATELETS: 342 10*3/uL (ref 150.0–400.0)
RBC: 4.51 Mil/uL (ref 3.87–5.11)
RDW: 13 % (ref 11.5–15.5)
WBC: 4.5 10*3/uL (ref 4.0–10.5)

## 2018-04-29 LAB — HM MAMMOGRAPHY

## 2018-04-29 LAB — COMPREHENSIVE METABOLIC PANEL
ALBUMIN: 4.2 g/dL (ref 3.5–5.2)
ALT: 21 U/L (ref 0–35)
AST: 24 U/L (ref 0–37)
Alkaline Phosphatase: 89 U/L (ref 39–117)
BUN: 16 mg/dL (ref 6–23)
CO2: 29 mEq/L (ref 19–32)
Calcium: 9.8 mg/dL (ref 8.4–10.5)
Chloride: 103 mEq/L (ref 96–112)
Creatinine, Ser: 0.87 mg/dL (ref 0.40–1.20)
GFR: 71.35 mL/min (ref >60.00)
GLUCOSE: 94 mg/dL (ref 70–99)
POTASSIUM: 4.4 meq/L (ref 3.5–5.1)
SODIUM: 139 meq/L (ref 135–145)
Total Bilirubin: 0.5 mg/dL (ref 0.2–1.2)
Total Protein: 7.2 g/dL (ref 6.0–8.3)

## 2018-04-29 LAB — TSH: TSH: 4.14 u[IU]/mL (ref 0.35–4.50)

## 2018-04-29 LAB — LIPID PANEL
CHOLESTEROL: 217 mg/dL — AB (ref 0–200)
HDL: 71.2 mg/dL (ref >39.00)
LDL Cholesterol: 132 mg/dL — ABNORMAL HIGH (ref 0–99)
NonHDL: 145.84
Total CHOL/HDL Ratio: 3
Triglycerides: 71 mg/dL (ref 0.0–149.0)
VLDL: 14.2 mg/dL (ref 0.0–40.0)

## 2018-04-29 LAB — VITAMIN D 25 HYDROXY (VIT D DEFICIENCY, FRACTURES): VITD: 31.9 ng/mL (ref 30.00–100.00)

## 2018-04-29 NOTE — Assessment & Plan Note (Signed)
Physical today 04/29/18.  PAP 04/10/16 - negative with negative HPV.  Mammogram 04/09/17 - Birads II.  Scheduled today for mammogram.  Had colonoscopy /10/14 - redundant colon.  Recommended f/u in 10 years.  Had shingrx 10/2017.  Received second shingrx today.

## 2018-04-29 NOTE — Progress Notes (Signed)
Patient ID: Whitney Guerrero, female   DOB: 18-Dec-1960, 57 y.o.   MRN: [id number]   Subjective:    Patient ID: Whitney Guerrero, female    DOB: April 22, 1961, 57 y.o.   MRN: [id number]  HPI  Patient here for a physical exam.  Doing well.  Feels good.  Stays active.  No chest pain.  No sob.  No acid reflux.  No abdominal pain.  Bowels moving.  No urine change.  Still with increased gas.  No correlation with specific foods.  Has adjusted diet.  Plans to keep food diary.     Past Medical History:  Diagnosis Date  . Osteoporosis    Past Surgical History:  Procedure Laterality Date  . BREAST BIOPSY    . TONSILLECTOMY AND ADENOIDECTOMY     Family History  Problem Relation Age of Onset  . Heart disease Father   . Colon cancer Maternal Uncle   . Colon cancer Cousin   . Breast cancer Neg Hx    Social History   Socioeconomic History  . Marital status: Married    Spouse name: Not on file  . Number of children: Not on file  . Years of education: Not on file  . Highest education level: Not on file  Occupational History  . Not on file  Social Needs  . Financial resource strain: Not on file  . Food insecurity:    Worry: Not on file    Inability: Not on file  . Transportation needs:    Medical: Not on file    Non-medical: Not on file  Tobacco Use  . Smoking status: Never Smoker  . Smokeless tobacco: Never Used  Substance and Sexual Activity  . Alcohol use: Yes    Alcohol/week: 0.0 standard drinks    Comment: one glass of wine every other night  . Drug use: No  . Sexual activity: Not on file  Lifestyle  . Physical activity:    Days per week: Not on file    Minutes per session: Not on file  . Stress: Not on file  Relationships  . Social connections:    Talks on phone: Not on file    Gets together: Not on file    Attends religious service: Not on file    Active member of club or organization: Not on file    Attends meetings of clubs or organizations: Not on file    Relationship  status: Not on file  Other Topics Concern  . Not on file  Social History Narrative  . Not on file    Outpatient Encounter Medications as of 04/29/2018  Medication Sig  . Calcium Carbonate-Vit D-Min 1200-1000 MG-UNIT CHEW Chew 1,200 mg by mouth daily.  Marland Kitchen loratadine (CLARITIN) 10 MG tablet Take 10 mg by mouth daily.  . vitamin C (ASCORBIC ACID) 500 MG tablet Take 500 mg by mouth daily.   No facility-administered encounter medications on file as of 04/29/2018.     Review of Systems  Constitutional: Negative for appetite change and unexpected weight change.  HENT: Negative for congestion and sinus pressure.   Eyes: Negative for pain and visual disturbance.  Respiratory: Negative for cough, chest tightness and shortness of breath.   Cardiovascular: Negative for chest pain, palpitations and leg swelling.  Gastrointestinal: Negative for abdominal pain, diarrhea, nausea and vomiting.       Increased gas.  May occur 1-2x/week.   Genitourinary: Negative for difficulty urinating and dysuria.  Musculoskeletal: Negative for joint swelling and myalgias.  Skin: Negative for color change and rash.  Neurological: Negative for dizziness, light-headedness and headaches.  Hematological: Negative for adenopathy. Does not bruise/bleed easily.  Psychiatric/Behavioral: Negative for agitation and dysphoric mood.       Objective:    Physical Exam  Constitutional: She is oriented to person, place, and time. She appears well-developed and well-nourished. No distress.  HENT:  Nose: Nose normal.  Mouth/Throat: Oropharynx is clear and moist.  Eyes: Right eye exhibits no discharge. Left eye exhibits no discharge. No scleral icterus.  Neck: Neck supple. No thyromegaly present.  Cardiovascular: Normal rate and regular rhythm.  Pulmonary/Chest: Breath sounds normal. No accessory muscle usage. No tachypnea. No respiratory distress. She has no decreased breath sounds. She has no wheezes. She has no rhonchi.  Right breast exhibits no inverted nipple, no mass, no nipple discharge and no tenderness (no axillary adenopathy). Left breast exhibits no inverted nipple, no mass, no nipple discharge and no tenderness (no axilarry adenopathy).  Abdominal: Soft. Bowel sounds are normal. There is no tenderness.  Musculoskeletal: She exhibits no edema or tenderness.  Lymphadenopathy:    She has no cervical adenopathy.  Neurological: She is alert and oriented to person, place, and time.  Skin: No rash noted. No erythema.  Psychiatric: She has a normal mood and affect. Her behavior is normal.    BP 118/78 (BP Location: Left Arm, Patient Position: Sitting, Cuff Size: Normal)   Pulse 85   Temp 98.1 F (36.7 C) (Oral)   Resp 16   Ht 5\' 3"  (1.6 m)   Wt 118 lb (53.5 kg)   SpO2 96%   BMI 20.90 kg/m  Wt Readings from Last 3 Encounters:  04/29/18 118 lb (53.5 kg)  04/15/17 115 lb 3.2 oz (52.3 kg)  09/06/16 115 lb 6 oz (52.3 kg)     Lab Results  Component Value Date   WBC 4.5 04/29/2018   HGB 14.3 04/29/2018   HCT 41.5 04/29/2018   PLT 342.0 04/29/2018   GLUCOSE 94 04/29/2018   CHOL 217 (H) 04/29/2018   TRIG 71.0 04/29/2018   HDL 71.20 04/29/2018   LDLDIRECT 129.2 04/04/2013   LDLCALC 132 (H) 04/29/2018   ALT 21 04/29/2018   AST 24 04/29/2018   NA 139 04/29/2018   K 4.4 04/29/2018   CL 103 04/29/2018   CREATININE 0.87 04/29/2018   BUN 16 04/29/2018   CO2 29 04/29/2018   TSH 4.14 04/29/2018       Assessment & Plan:   Problem List Items Addressed This Visit    Health care maintenance    Physical today 04/29/18.  PAP 04/10/16 - negative with negative HPV.  Mammogram 04/09/17 - Birads II.  Scheduled today for mammogram.  Had colonoscopy /10/14 - redundant colon.  Recommended f/u in 10 years.  Had shingrx 10/2017.  Received second shingrx today.        Osteoporosis    Follow vitamin D level.  Weight bearing exercise, calcium and vitamin D.       Relevant Orders   CBC with  Differential/Platelet (Completed)   Comprehensive metabolic panel (Completed)   TSH (Completed)   VITAMIN D 25 Hydroxy (Vit-D Deficiency, Fractures) (Completed)    Other Visit Diagnoses    Need for shingles vaccine    -  Primary   Relevant Orders   Varicella-zoster vaccine IM (Shingrix) (Completed)   Screening cholesterol level       Relevant Orders   Lipid panel (Completed)     Increased gas.  Discussed with her today.  She has adjusted her diet.  Tried probiotics.  Discussed GasX.  Discussed GI evaluation.  She will keep food diary.    Dale [place]port, MD

## 2018-04-29 NOTE — Assessment & Plan Note (Signed)
Follow vitamin D level.  Weight bearing exercise, calcium and vitamin D.

## 2018-05-07 ENCOUNTER — Ambulatory Visit: Payer: BC Managed Care – PPO

## 2018-10-11 ENCOUNTER — Telehealth: Payer: BC Managed Care – PPO | Admitting: Family

## 2018-10-11 DIAGNOSIS — R059 Cough, unspecified: Secondary | ICD-10-CM

## 2018-10-11 DIAGNOSIS — R05 Cough: Secondary | ICD-10-CM

## 2018-10-11 DIAGNOSIS — R0602 Shortness of breath: Secondary | ICD-10-CM

## 2018-10-11 DIAGNOSIS — R112 Nausea with vomiting, unspecified: Secondary | ICD-10-CM

## 2018-10-11 MED ORDER — ALBUTEROL SULFATE HFA 108 (90 BASE) MCG/ACT IN AERS: 2.0000 | INHALATION_SPRAY | Freq: Four times a day (QID) | RESPIRATORY_TRACT | 0 refills | Status: DC | PRN

## 2018-10-11 MED ORDER — ONDANSETRON HCL 4 MG PO TABS: 4.0000 mg | ORAL_TABLET | Freq: Three times a day (TID) | ORAL | 0 refills | Status: DC | PRN

## 2018-10-11 MED ORDER — BENZONATATE 100 MG PO CAPS: 100.0000 mg | ORAL_CAPSULE | Freq: Three times a day (TID) | ORAL | 0 refills | Status: DC | PRN

## 2018-10-11 NOTE — Addendum Note (Signed)
Addended by: Beau Fanny on: 10/11/2018 01:10 PM   Modules accepted: Orders

## 2018-10-11 NOTE — Progress Notes (Signed)
Greater than 5 minutes, yet less than 10 minutes of time have been spent researching, coordinating, and implementing care for this patient today.  Thank you for the details you included in the comment boxes. Those details are very helpful in determining the best course of treatment for you and help us to provide the best care.  E-Visit for Corona Virus Screening  Based on your current symptoms, you may very well have the virus, however your symptoms are mild. Currently, not all patients are being tested. If the symptoms are mild and there is not a known exposure, performing the test is not indicated.  You have been enrolled in MyChart Home Monitoring for COVID-19. Daily you will receive a questionnaire within the MyChart website. Our COVID-19 response team will be monitoring your responses daily.   Coronavirus disease 2019 (COVID-19) is a respiratory illness that can spread from person to person. The virus that causes COVID-19 is a new virus that was first identified in the country of [place] but is now found in multiple other countries and has spread to the [place].  Symptoms associated with the virus are mild to severe fever, cough, and shortness of breath. There is currently no vaccine to protect against COVID-19, and there is no specific antiviral treatment for the virus.   To be considered HIGH RISK for Coronavirus (COVID-19), you have to meet the following criteria:  . Traveled to [place], [place], [place], [place] or [place]; or in the [place] to [place], [place], [place], or [place]; and have fever, cough, and shortness of breath within the last 2 weeks of travel OR  . Been in close contact with a person diagnosed with COVID-19 within the last 2 weeks and have fever, cough, and shortness of breath  . IF YOU DO NOT MEET THESE CRITERIA, YOU ARE CONSIDERED LOW RISK FOR COVID-19.   It is vitally important that if you feel that you have an infection such as this virus or any  other virus that you stay home and away from places where you may spread it to others.  You should self-quarantine for 14 days if you have symptoms that could potentially be coronavirus and avoid contact with people age 65 and older.   You can use medication such as A prescription cough medication called Tessalon Perles 100 mg. You may take 1-2 capsules every 8 hours as needed for cough and A prescription inhaler called Albuterol MDI 90 mcg /actuation 2 puffs every 4 hours as needed for shortness of breath, wheezing, cough  You may also take acetaminophen (Tylenol) as needed for fever.   Reduce your risk of any infection by using the same precautions used for avoiding the common cold or flu:  . Wash your hands often with soap and warm water for at least 20 seconds.  If soap and water are not readily available, use an alcohol-based hand sanitizer with at least 60% alcohol.  . If coughing or sneezing, cover your mouth and nose by coughing or sneezing into the elbow areas of your shirt or coat, into a tissue or into your sleeve (not your hands). . Avoid shaking hands with others and consider head nods or verbal greetings only. . Avoid touching your eyes, nose, or mouth with unwashed hands.  . Avoid close contact with people who are sick. . Avoid places or events with large numbers of people in one location, like concerts or sporting events. . Carefully consider travel plans you have or are   making. . If you are planning any travel outside or inside the [place], visit the CDC's Travelers' Health webpage for the latest health notices. . If you have some symptoms but not all symptoms, continue to monitor at home and seek medical attention if your symptoms worsen. . If you are having a medical emergency, call 911.  HOME CARE . Only take medications as instructed by your medical team. . Drink plenty of fluids and get plenty of rest. . A steam or ultrasonic humidifier can help if you have congestion.   GET  HELP RIGHT AWAY IF: . You develop worsening fever. . You become short of breath . You cough up blood. . Your symptoms become more severe MAKE SURE YOU   Understand these instructions.  Will watch your condition.  Will get help right away if you are not doing well or get worse.  Your e-visit answers were reviewed by a board certified advanced clinical practitioner to complete your personal care plan.  Depending on the condition, your plan could have included both over the counter or prescription medications.  If there is a problem please reply once you have received a response from your provider. Your safety is important to us.  If you have drug allergies check your prescription carefully.    You can use MyChart to ask questions about today's visit, request a non-urgent call back, or ask for a work or school excuse for 24 hours related to this e-Visit. If it has been greater than 24 hours you will need to follow up with your provider, or enter a new e-Visit to address those concerns. You will get an e-mail in the next two days asking about your experience.  I hope that your e-visit has been valuable and will speed your recovery. Thank you for using e-visits.    

## 2019-02-06 ENCOUNTER — Encounter: Payer: Self-pay | Admitting: Internal Medicine

## 2019-04-06 ENCOUNTER — Encounter: Payer: Self-pay | Admitting: Internal Medicine

## 2019-04-08 NOTE — Telephone Encounter (Signed)
Pt scheduled  

## 2019-04-09 ENCOUNTER — Ambulatory Visit (INDEPENDENT_AMBULATORY_CARE_PROVIDER_SITE_OTHER): Payer: BC Managed Care – PPO | Admitting: Internal Medicine

## 2019-04-09 ENCOUNTER — Telehealth: Payer: Self-pay | Admitting: Internal Medicine

## 2019-04-09 ENCOUNTER — Other Ambulatory Visit: Payer: Self-pay

## 2019-04-09 DIAGNOSIS — M542 Cervicalgia: Secondary | ICD-10-CM | POA: Diagnosis not present

## 2019-04-09 DIAGNOSIS — R9389 Abnormal findings on diagnostic imaging of other specified body structures: Secondary | ICD-10-CM

## 2019-04-09 DIAGNOSIS — T7840XA Allergy, unspecified, initial encounter: Secondary | ICD-10-CM

## 2019-04-09 MED ORDER — TIZANIDINE HCL 4 MG PO TABS: 4.0000 mg | ORAL_TABLET | Freq: Every evening | ORAL | 0 refills | Status: DC | PRN

## 2019-04-09 NOTE — Telephone Encounter (Signed)
My chart message sent to pt for update.   

## 2019-04-09 NOTE — Progress Notes (Signed)
Patient ID: Whitney Guerrero, female   DOB: 1961/02/04, 58 y.o.   MRN: [id number]   Virtual Visit via video Note  This visit type was conducted due to national recommendations for restrictions regarding the COVID-19 pandemic (e.g. social distancing).  This format is felt to be most appropriate for this patient at this time.  All issues noted in this document were discussed and addressed.  No physical exam was performed (except for noted visual exam findings with Video Visits).   I connected with Basil Dess by a video enabled telemedicine application and verified that I am speaking with the correct person using two identifiers. Location patient: home Location provider: work Persons participating in the virtual visit: patient, provider  I discussed the limitations, risks, security and privacy concerns of performing an evaluation and management service by video and the availability of in person appointments.  The patient expressed understanding and agreed to proceed.   Reason for visit: work in appt  HPI: Reports over the last several months, she has noticed some increased pain right posterior lateral neck and extends into her right shoulder and arm.  No numbness or tingling.  No weakness.  Persistent.  Affecting her sleep.  Taking ibuprofen.  No headache.  Recently had some allergy symptoms.  Noticed a swollen gland - right neck.  Started taking claritin and mucinex.  Symptoms better.  No significant swelling in her neck now - states improved/resolved.  No cough or chest congestion.  No chest pain or sob.     ROS: See pertinent positives and negatives per HPI.  Past Medical History:  Diagnosis Date  . Osteoporosis     Past Surgical History:  Procedure Laterality Date  . BREAST BIOPSY    . TONSILLECTOMY AND ADENOIDECTOMY      Family History  Problem Relation Age of Onset  . Heart disease Father   . Colon cancer Maternal Uncle   . Colon cancer Cousin   . Breast cancer Neg Hx      SOCIAL HX: reviewed.    Current Outpatient Medications:  .  Calcium Carbonate-Vit D-Min 1200-1000 MG-UNIT CHEW, Chew 1,200 mg by mouth daily., Disp: , Rfl:  .  loratadine (CLARITIN) 10 MG tablet, Take 10 mg by mouth daily., Disp: , Rfl:  .  tiZANidine (ZANAFLEX) 4 MG tablet, Take 1 tablet (4 mg total) by mouth at bedtime as needed for muscle spasms., Disp: 20 tablet, Rfl: 0  EXAM:  GENERAL: alert, oriented, appears well and in no acute distress  HEENT: atraumatic, conjunttiva clear, no obvious abnormalities on inspection of external nose and ears  NECK: normal movements of the head and neck  LUNGS: on inspection no signs of respiratory distress, breathing rate appears normal, no obvious gross SOB, gasping or wheezing  CV: no obvious cyanosis  PSYCH/NEURO: pleasant and cooperative, no obvious depression or anxiety, speech and thought processing grossly intact  ASSESSMENT AND PLAN:  Discussed the following assessment and plan:  Neck pain Persistent neck and right arm pain as outlined.  Affecting her sleep.  Some discomfort with rotation of head.  Has been taking ibuprofen without relief.  Trial of zanaflex - to take a night.  Check c-spine xray.  Further w/up pending results/response.    Allergies Taking claritin and has taken mucinex.  Better.  Neck nodule - resolved.  Follow.      I discussed the assessment and treatment plan with the patient. The patient was provided an opportunity to ask questions and all were answered.  The patient agreed with the plan and demonstrated an understanding of the instructions.   The patient was advised to call back or seek an in-person evaluation if the symptoms worsen or if the condition fails to improve as anticipated.   Einar Pheasant, MD

## 2019-04-13 ENCOUNTER — Encounter: Payer: Self-pay | Admitting: Internal Medicine

## 2019-04-13 DIAGNOSIS — T7840XA Allergy, unspecified, initial encounter: Secondary | ICD-10-CM | POA: Insufficient documentation

## 2019-04-13 NOTE — Assessment & Plan Note (Signed)
Persistent neck and right arm pain as outlined.  Affecting her sleep.  Some discomfort with rotation of head.  Has been taking ibuprofen without relief.  Trial of zanaflex - to take a night.  Check c-spine xray.  Further w/up pending results/response.

## 2019-04-13 NOTE — Assessment & Plan Note (Signed)
Taking claritin and has taken mucinex.  Better.  Neck nodule - resolved.  Follow.

## 2019-04-14 ENCOUNTER — Ambulatory Visit
Admission: RE | Admit: 2019-04-14 | Discharge: 2019-04-14 | Disposition: A | Discharge status: Home / Self Care | Payer: BC Managed Care – PPO | Source: Ambulatory Visit | Attending: Internal Medicine | Admitting: Internal Medicine

## 2019-04-14 DIAGNOSIS — M779 Enthesopathy, unspecified: Secondary | ICD-10-CM | POA: Insufficient documentation

## 2019-04-14 DIAGNOSIS — M79601 Pain in right arm: Secondary | ICD-10-CM | POA: Insufficient documentation

## 2019-04-14 DIAGNOSIS — M542 Cervicalgia: Secondary | ICD-10-CM | POA: Diagnosis not present

## 2019-04-15 NOTE — Telephone Encounter (Signed)
Order placed for mri c-spine 

## 2019-04-15 NOTE — Addendum Note (Signed)
Addended by: Alisa Graff on: 04/15/2019 09:12 PM   Modules accepted: Orders

## 2019-05-05 ENCOUNTER — Encounter: Payer: BC Managed Care – PPO | Admitting: Internal Medicine

## 2019-05-05 ENCOUNTER — Ambulatory Visit: Payer: BC Managed Care – PPO

## 2019-06-19 ENCOUNTER — Telehealth: Payer: Self-pay | Admitting: Internal Medicine

## 2019-06-19 NOTE — Telephone Encounter (Signed)
Pt would like to have her MRI at [facility] in [facility] [phone] she gets her mammo done there. Pt states it is a lot cheaper there. Pt would like to be scheduled there. Please and thank you!  Call pt @ [phone].

## 2019-07-03 ENCOUNTER — Encounter: Payer: Self-pay | Admitting: Internal Medicine

## 2019-07-28 ENCOUNTER — Other Ambulatory Visit: Payer: Self-pay

## 2019-07-28 ENCOUNTER — Ambulatory Visit (INDEPENDENT_AMBULATORY_CARE_PROVIDER_SITE_OTHER): Payer: BC Managed Care – PPO | Admitting: Internal Medicine

## 2019-07-28 ENCOUNTER — Other Ambulatory Visit (HOSPITAL_COMMUNITY)
Admission: RE | Admit: 2019-07-28 | Discharge: 2019-07-28 | Disposition: A | Discharge status: Home / Self Care | Payer: BC Managed Care – PPO | Source: Ambulatory Visit | Attending: Internal Medicine | Admitting: Internal Medicine

## 2019-07-28 ENCOUNTER — Encounter: Payer: Self-pay | Admitting: Internal Medicine

## 2019-07-28 VITALS — BP 118/80 | HR 83 | Temp 97.7°F | Resp 16 | Ht 64.0 in | Wt 116.0 lb

## 2019-07-28 DIAGNOSIS — M810 Age-related osteoporosis without current pathological fracture: Secondary | ICD-10-CM

## 2019-07-28 DIAGNOSIS — Z124 Encounter for screening for malignant neoplasm of cervix: Secondary | ICD-10-CM | POA: Insufficient documentation

## 2019-07-28 DIAGNOSIS — Z0000 Encounter for general adult medical examination without abnormal findings: Secondary | ICD-10-CM

## 2019-07-28 DIAGNOSIS — F439 Reaction to severe stress, unspecified: Secondary | ICD-10-CM

## 2019-07-28 DIAGNOSIS — M542 Cervicalgia: Secondary | ICD-10-CM

## 2019-07-28 DIAGNOSIS — Z13220 Encounter for screening for lipoid disorders: Secondary | ICD-10-CM

## 2019-07-28 DIAGNOSIS — R1907 Generalized intra-abdominal and pelvic swelling, mass and lump: Secondary | ICD-10-CM

## 2019-07-28 DIAGNOSIS — R1904 Left lower quadrant abdominal swelling, mass and lump: Secondary | ICD-10-CM

## 2019-07-28 LAB — CBC WITH DIFFERENTIAL/PLATELET
Basophils Absolute: 0 10*3/uL (ref 0.0–0.1)
Basophils Relative: 0.7 % (ref 0.0–3.0)
Eosinophils Absolute: 0.1 10*3/uL (ref 0.0–0.7)
Eosinophils Relative: 1.6 % (ref 0.0–5.0)
HCT: 42.7 % (ref 36.0–46.0)
Hemoglobin: 14.6 g/dL (ref 12.0–15.0)
Lymphocytes Relative: 32 % (ref 12.0–46.0)
Lymphs Abs: 1.4 10*3/uL (ref 0.7–4.0)
MCHC: 34.1 g/dL (ref 30.0–36.0)
MCV: 91.5 fl (ref 78.0–100.0)
Monocytes Absolute: 0.4 10*3/uL (ref 0.1–1.0)
Monocytes Relative: 10.2 % (ref 3.0–12.0)
Neutro Abs: 2.4 10*3/uL (ref 1.4–7.7)
Neutrophils Relative %: 55.5 % (ref 43.0–77.0)
Platelets: 328 10*3/uL (ref 150.0–400.0)
RBC: 4.67 Mil/uL (ref 3.87–5.11)
RDW: 12.7 % (ref 11.5–15.5)
WBC: 4.4 10*3/uL (ref 4.0–10.5)

## 2019-07-28 LAB — TSH: TSH: 2.69 u[IU]/mL (ref 0.35–4.50)

## 2019-07-28 LAB — LIPID PANEL
Cholesterol: 224 mg/dL — ABNORMAL HIGH (ref 0–200)
HDL: 70.8 mg/dL (ref >39.00)
LDL Cholesterol: 136 mg/dL — ABNORMAL HIGH (ref 0–99)
NonHDL: 153.28
Total CHOL/HDL Ratio: 3
Triglycerides: 86 mg/dL (ref 0.0–149.0)
VLDL: 17.2 mg/dL (ref 0.0–40.0)

## 2019-07-28 LAB — COMPREHENSIVE METABOLIC PANEL
ALT: 14 U/L (ref 0–35)
AST: 22 U/L (ref 0–37)
Albumin: 4.1 g/dL (ref 3.5–5.2)
Alkaline Phosphatase: 82 U/L (ref 39–117)
BUN: 13 mg/dL (ref 6–23)
CO2: 27 mEq/L (ref 19–32)
Calcium: 9.7 mg/dL (ref 8.4–10.5)
Chloride: 104 mEq/L (ref 96–112)
Creatinine, Ser: 0.78 mg/dL (ref 0.40–1.20)
GFR: 75.81 mL/min (ref >60.00)
Glucose, Bld: 93 mg/dL (ref 70–99)
Potassium: 3.8 mEq/L (ref 3.5–5.1)
Sodium: 139 mEq/L (ref 135–145)
Total Bilirubin: 0.5 mg/dL (ref 0.2–1.2)
Total Protein: 7.3 g/dL (ref 6.0–8.3)

## 2019-07-28 LAB — VITAMIN D 25 HYDROXY (VIT D DEFICIENCY, FRACTURES): VITD: 32.69 ng/mL (ref 30.00–100.00)

## 2019-07-28 LAB — HM MAMMOGRAPHY

## 2019-07-28 NOTE — Progress Notes (Signed)
Patient ID: AIYONNA LUCADO, female   DOB: [birth year], 59 y.o.   MRN: [id number]   Subjective:    Patient ID: Rhea Belton, female    DOB: Mar 28, 1961, 59 y.o.   MRN: [id number]  HPI This visit occurred during the SARS-CoV-2 public health emergency.  Safety protocols were in place, including screening questions prior to the visit, additional usage of staff PPE, and extensive cleaning of exam room while observing appropriate contact time as indicated for disinfecting solutions.  Patient here for her physical exam.  She reports she is doing relatively well.  Had e visit 09/2018 - suspected covid.  Better now.  Stays active.  No chest pain.  Breathing stable.  No acid reflux.  Has been having continued neck and arm pain.  c-spine xray - spurring at C4-5 and c5-6, which was felt like to contribute to foraminal stenosis.  Planning for MRI c-spine.  Also schedule to have mammogram today.  No abdominal pain.  Bowels moving. Handling stress.     Past Medical History:  Diagnosis Date  . Osteoporosis    Past Surgical History:  Procedure Laterality Date  . BREAST BIOPSY    . TONSILLECTOMY AND ADENOIDECTOMY     Family History  Problem Relation Age of Onset  . Heart disease Father   . Colon cancer Maternal Uncle   . Colon cancer Cousin   . Breast cancer Neg Hx    Social History   Socioeconomic History  . Marital status: Married    Spouse name: Not on file  . Number of children: Not on file  . Years of education: Not on file  . Highest education level: Not on file  Occupational History  . Not on file  Tobacco Use  . Smoking status: Never Smoker  . Smokeless tobacco: Never Used  Substance and Sexual Activity  . Alcohol use: Yes    Alcohol/week: 0.0 standard drinks    Comment: one glass of wine every other night  . Drug use: No  . Sexual activity: Not on file  Other Topics Concern  . Not on file  Social History Narrative  . Not on file   Social Determinants of Health   Financial  Resource Strain:   . Difficulty of Paying Living Expenses: Not on file  Food Insecurity:   . Worried About Programme researcher, broadcasting/film/video in the Last Year: Not on file  . Ran Out of Food in the Last Year: Not on file  Transportation Needs:   . Lack of Transportation (Medical): Not on file  . Lack of Transportation (Non-Medical): Not on file  Physical Activity:   . Days of Exercise per Week: Not on file  . Minutes of Exercise per Session: Not on file  Stress:   . Feeling of Stress : Not on file  Social Connections:   . Frequency of Communication with Friends and Family: Not on file  . Frequency of Social Gatherings with Friends and Family: Not on file  . Attends Religious Services: Not on file  . Active Member of Clubs or Organizations: Not on file  . Attends Banker Meetings: Not on file  . Marital Status: Not on file    Outpatient Encounter Medications as of 07/28/2019  Medication Sig  . Calcium Carbonate-Vit D-Min 1200-1000 MG-UNIT CHEW Chew 1,200 mg by mouth daily.  . [DISCONTINUED] loratadine (CLARITIN) 10 MG tablet Take 10 mg by mouth daily.  . [DISCONTINUED] tiZANidine (ZANAFLEX) 4 MG tablet Take 1 tablet (4  mg total) by mouth at bedtime as needed for muscle spasms.   No facility-administered encounter medications on file as of 07/28/2019.    Review of Systems  Constitutional: Negative for appetite change and unexpected weight change.  HENT: Negative for congestion and sinus pressure.   Eyes: Negative for pain and visual disturbance.  Respiratory: Negative for cough, chest tightness and shortness of breath.   Cardiovascular: Negative for chest pain, palpitations and leg swelling.  Gastrointestinal: Negative for abdominal pain, diarrhea, nausea and vomiting.  Genitourinary: Negative for difficulty urinating and dysuria.  Musculoskeletal: Positive for neck pain. Negative for joint swelling and myalgias.       Neck, shoulder and upper arm pain - right.    Skin: Negative for  color change and rash.  Neurological: Negative for dizziness, light-headedness and headaches.  Hematological: Negative for adenopathy. Does not bruise/bleed easily.  Psychiatric/Behavioral: Negative for agitation and dysphoric mood.       Objective:    Physical Exam Constitutional:      General: She is not in acute distress.    Appearance: Normal appearance. She is well-developed.  HENT:     Head: Normocephalic and atraumatic.     Right Ear: External ear normal.     Left Ear: External ear normal.  Eyes:     General: No scleral icterus.       Right eye: No discharge.        Left eye: No discharge.     Conjunctiva/sclera: Conjunctivae normal.  Neck:     Thyroid: No thyromegaly.  Cardiovascular:     Rate and Rhythm: Normal rate and regular rhythm.  Pulmonary:     Effort: No tachypnea, accessory muscle usage or respiratory distress.     Breath sounds: Normal breath sounds. No decreased breath sounds or wheezing.  Chest:     Breasts:        Right: No inverted nipple, mass, nipple discharge or tenderness (no axillary adenopathy).        Left: No inverted nipple, mass, nipple discharge or tenderness (no axilarry adenopathy).  Abdominal:     General: Bowel sounds are normal.     Palpations: Abdomen is soft.     Tenderness: There is no abdominal tenderness.     Comments: LLQ fullness/mass  Genitourinary:    Comments: Normal external genitalia.  Vaginal vault without lesions.  Cervix identified.  Pap smear performed.  Could not appreciate any adnexal masses or tenderness.   Musculoskeletal:        General: No swelling or tenderness.     Cervical back: Neck supple. No tenderness.  Lymphadenopathy:     Cervical: No cervical adenopathy.  Skin:    Findings: No erythema or rash.  Neurological:     Mental Status: She is alert and oriented to person, place, and time.  Psychiatric:        Mood and Affect: Mood normal.        Behavior: Behavior normal.     BP 118/80   Pulse 83    Temp 97.7 F (36.5 C)   Resp 16   Ht 5\' 4"  (1.626 m)   Wt 116 lb (52.6 kg)   SpO2 98%   BMI 19.91 kg/m  Wt Readings from Last 3 Encounters:  07/28/19 116 lb (52.6 kg)  04/09/19 115 lb (52.2 kg)  04/29/18 118 lb (53.5 kg)     Lab Results  Component Value Date   WBC 4.4 07/28/2019   HGB 14.6 07/28/2019  HCT 42.7 07/28/2019   PLT 328.0 07/28/2019   GLUCOSE 93 07/28/2019   CHOL 224 (H) 07/28/2019   TRIG 86.0 07/28/2019   HDL 70.80 07/28/2019   LDLDIRECT 129.2 04/04/2013   LDLCALC 136 (H) 07/28/2019   ALT 14 07/28/2019   AST 22 07/28/2019   NA 139 07/28/2019   K 3.8 07/28/2019   CL 104 07/28/2019   CREATININE 0.78 07/28/2019   BUN 13 07/28/2019   CO2 27 07/28/2019   TSH 2.69 07/28/2019    DG Cervical Spine 2 or 3 views  Result Date: 04/14/2019 CLINICAL DATA:  Persistent neck pain. Right arm pain. EXAM: CERVICAL SPINE - 2-3 VIEW COMPARISON:  None. FINDINGS: Cervical spine is visualized from the skull base through the cervicothoracic junction. There is no significant listhesis. Vertebral body heights are maintained. There straightening of the normal cervical lordosis. Uncovertebral spurring is most evident at C4-5 and C5-6, right greater than left. Moderate uncovertebral spurring is present at C6-7. No acute or healing fractures are present. Soft tissues are unremarkable. IMPRESSION: 1. No acute or healing fracture. 2. 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 3. No significant listhesis. Electronically Signed   By: San Morelle M.D.   On: 04/14/2019 09:56       Assessment & Plan:   Problem List Items Addressed This Visit    Health care maintenance    Physical today 07/28/19.  PAP 07/28/19.  Mammogram scheduled today.  Colonoscopy 02/2013 - redundant colon.  Recommended f/u in 10 years.        Left lower quadrant abdominal mass    Fullness/mass  palpated left lower quadrant.  Schedule CT abdomen and pelvis to evaluate.        Relevant Orders   CT Abdomen Pelvis W Contrast   Neck pain    Persistent neck, shoulder and upper arm pain.  Recent c-spine xray as outlined.  Planning for MRI. Working through Insurance underwriter issues.  Planning to have at [facility].        Osteoporosis    Recheck vitamin D level.  Calcium, vitamin D and weight bearing exercise.        Relevant Orders   CBC with Differential/Platelet (Completed)   Comprehensive metabolic panel (Completed)   TSH (Completed)   VITAMIN D 25 Hydroxy (Vit-D Deficiency, Fractures) (Completed)   Stress    Handling stress.  Follow.         Other Visit Diagnoses    Routine general medical examination at a health care facility    -  Primary   Cervical cancer screening       Relevant Orders   Cytology - PAP( [facility]) (Completed)   Screening cholesterol level       Relevant Orders   Lipid panel (Completed)   Generalized intra-abdominal and pelvic swelling, mass and lump       Relevant Orders   CT Abdomen Pelvis W Contrast       Einar Pheasant, MD

## 2019-07-28 NOTE — Assessment & Plan Note (Signed)
Physical today 07/28/19.  PAP 07/28/19.  Mammogram scheduled today.  Colonoscopy 02/2013 - redundant colon.  Recommended f/u in 10 years.

## 2019-07-29 ENCOUNTER — Encounter: Payer: Self-pay | Admitting: Internal Medicine

## 2019-07-29 LAB — CYTOLOGY - PAP
Comment: NEGATIVE
Diagnosis: NEGATIVE
High risk HPV: NEGATIVE

## 2019-07-30 ENCOUNTER — Encounter: Payer: Self-pay | Admitting: Internal Medicine

## 2019-08-03 DIAGNOSIS — R1904 Left lower quadrant abdominal swelling, mass and lump: Secondary | ICD-10-CM | POA: Insufficient documentation

## 2019-08-03 NOTE — Assessment & Plan Note (Signed)
Fullness/mass palpated left lower quadrant.  Schedule CT abdomen and pelvis to evaluate.

## 2019-08-03 NOTE — Assessment & Plan Note (Signed)
Persistent neck, shoulder and upper arm pain.  Recent c-spine xray as outlined.  Planning for MRI. Working through Community education officer issues.  Planning to have at [facility].

## 2019-08-03 NOTE — Assessment & Plan Note (Signed)
Handling stress.  Follow.   

## 2019-08-03 NOTE — Assessment & Plan Note (Signed)
Recheck vitamin D level.  Calcium, vitamin D and weight bearing exercise.

## 2019-08-14 ENCOUNTER — Encounter: Payer: Self-pay | Admitting: Internal Medicine

## 2019-08-15 NOTE — Telephone Encounter (Signed)
Please call pt and notify her that her CT scan reveals no mass visualized. Did have moderate amount of stool in colon.  Is she still noticing the palpable area - LLQ.  Would like to schedule her for a f/u office visit to reexamine her.

## 2019-08-15 NOTE — Telephone Encounter (Signed)
Pt is following up on message sent yesterday.

## 2019-08-15 NOTE — Telephone Encounter (Signed)
Patient stated she does not feel the area anymore. She has a f/u in June and would like to re-examine then. If any acute issues prior to appt, she will let us know. Also, MRI scheduled for 3/29

## 2019-09-16 ENCOUNTER — Encounter: Payer: Self-pay | Admitting: Internal Medicine

## 2019-11-03 ENCOUNTER — Ambulatory Visit: Payer: BC Managed Care – PPO | Admitting: Internal Medicine

## 2019-11-10 ENCOUNTER — Ambulatory Visit: Payer: BC Managed Care – PPO | Admitting: Internal Medicine

## 2019-11-26 ENCOUNTER — Encounter: Payer: Self-pay | Admitting: Internal Medicine

## 2019-11-28 NOTE — Telephone Encounter (Signed)
Called pt and unable to reach.  Left her a message.  I am going to forward this to Rasheedah as well.  Just let me know what we need to do to get the MRI scheduled.  Do I need to reorder?

## 2019-12-17 ENCOUNTER — Encounter: Payer: Self-pay | Admitting: Internal Medicine

## 2019-12-17 DIAGNOSIS — M542 Cervicalgia: Secondary | ICD-10-CM

## 2019-12-23 ENCOUNTER — Telehealth: Payer: Self-pay | Admitting: Internal Medicine

## 2019-12-23 NOTE — Telephone Encounter (Signed)
err

## 2019-12-23 NOTE — Telephone Encounter (Signed)
Pt called regarding MRI. I advised pt of my note from 11/07/2019 below  I called [employer] to get auth for MRI. Pt  Needs PT or home exercise. Nurse was notified.  And   I spoke with pt about appt being cancelled and whats needed. I advised pt she will get a follow up call.  Pt would like a call please advise and Thank you!  Once the above is completed MRI should be approved.  Pt MRI was approved   Authorization changed from [facility] to [facility] [address] #[id number] valid 04/11/19-10/17/19 Faxed [phone] Scheduled 2/9 @ 10:00 am  Pt stated she  Could not go because she had to get a CT the auth expired.

## 2019-12-23 NOTE — Telephone Encounter (Signed)
Do I need to do anything with this?  Do I need to reorder MRI?  I am not sure what to do.  Just let me know if I need to do anything.  Has anyone talked to the pt?

## 2019-12-26 NOTE — Telephone Encounter (Signed)
Discussed with rasheedah and pt. Even with new order for MRI- requires PT or home exercises to be approved. Do you have any home exercises that you recommend her trying to see if they help. Can document trial and effectiveness in OV note to send over to help with approval. See me about this Monday.

## 2019-12-26 NOTE — Telephone Encounter (Signed)
See me before calling pt.  Given her persistent neck and shoulder/arm pain with spurring at C4-5 and C5-6 with question of foraminal stenosis - I would like to refer to neurosurgery for further evaluation and w/up.  If agreeable, let me know and I will place order for referral.

## 2019-12-31 NOTE — Telephone Encounter (Signed)
Pt agreeable to see neurosurgery

## 2019-12-31 NOTE — Telephone Encounter (Signed)
Order placed for neurosurgery referral 

## 2020-01-26 NOTE — Telephone Encounter (Signed)
Patient called and wanted to give office some insurance information about note below. [employer] said if Dr. Lorin Picket put in that it is a medical necessity, must call AIM department, [phone]. Patient wanted to make sure this processes was done. She hopes this will work so she can get a MRI. Patient would like a call back about the out come.

## 2020-01-29 NOTE — Telephone Encounter (Signed)
Patient stated that she spoke with her insurance company and they provided her with this number to call because they need medical necessity from a provider stating why they recommend she has the MRI done. She was referred to neurosurgery but they are closed on mondays and would prefer for Dr Lorin Picket to call the number provided and see if she can avoid seeing a specialist.

## 2020-01-30 NOTE — Telephone Encounter (Signed)
Dr Lorin Picket referred to neurosurgery. Stated that Dr Lorin Picket could call the number below and give medical necessity?

## 2020-01-30 NOTE — Telephone Encounter (Signed)
Reviewed chart. Please clarify with pt her current symptoms - neck, arm pain, etc?  Also, what information is needed prior to calling?  Is there a case number, claim number, etc?

## 2020-02-13 NOTE — Telephone Encounter (Signed)
Order placed for physical therapy.  My chart message sent to pt.

## 2020-03-10 NOTE — Telephone Encounter (Signed)
Pt would like a call back about her physical therapy order and what is covered and how long  Also told pt that she needed to call her Physical Therapy office

## 2020-03-12 ENCOUNTER — Encounter: Payer: Self-pay | Admitting: Internal Medicine

## 2020-03-15 ENCOUNTER — Telehealth: Payer: Self-pay | Admitting: Internal Medicine

## 2020-03-15 NOTE — Telephone Encounter (Signed)
Per insurance, she has to go to PT first and update on symptos.  See note.

## 2020-03-15 NOTE — Telephone Encounter (Signed)
Rejection Reason - Patient did not respond - patient cancelled appt for 01/16/2020 and never called back to reschedule" [facility] said on Mar 15, 2020 11:17 AM

## 2020-03-15 NOTE — Telephone Encounter (Signed)
Can schedule a virtual visit to discuss and document  ongoing symptoms, etc.

## 2020-03-22 ENCOUNTER — Other Ambulatory Visit: Payer: Self-pay

## 2020-03-22 ENCOUNTER — Encounter: Payer: Self-pay | Admitting: Internal Medicine

## 2020-03-22 ENCOUNTER — Telehealth (INDEPENDENT_AMBULATORY_CARE_PROVIDER_SITE_OTHER): Payer: BC Managed Care – PPO | Admitting: Internal Medicine

## 2020-03-22 DIAGNOSIS — T7840XA Allergy, unspecified, initial encounter: Secondary | ICD-10-CM

## 2020-03-22 DIAGNOSIS — M542 Cervicalgia: Secondary | ICD-10-CM | POA: Diagnosis not present

## 2020-03-22 NOTE — Progress Notes (Signed)
Patient has been doing physical therapy. Also wanting to go over MRI results.

## 2020-03-28 ENCOUNTER — Encounter: Payer: Self-pay | Admitting: Internal Medicine

## 2020-03-28 NOTE — Assessment & Plan Note (Signed)
Persistent neck, shoulder, shoulder blade and upper arm pain as outlined.  Has tried antiinflammatories, muscle relaxer and home exercises.  Also has been to physical therapy.  Very minimal improvement.  Persistent pain - limiting rom and mobility.  Discussed further w/up.  Xray as outlined.  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  Needs MRI c-spine for further evaluation.  Order placed.  Further w/up pending results.

## 2020-03-28 NOTE — Assessment & Plan Note (Signed)
Rash as outlined.  Continue antihistamine and cortisone cream.  Follow. Notify me if persistent.

## 2020-03-28 NOTE — Progress Notes (Signed)
Patient ID: Whitney Guerrero, female   DOB: [birth year], 59 y.o.   MRN: [id number]   Virtual Visit via video Note  This visit type was conducted due to national recommendations for restrictions regarding the COVID-19 pandemic (e.g. social distancing).  This format is felt to be most appropriate for this patient at this time.  All issues noted in this document were discussed and addressed.  No physical exam was performed (except for noted visual exam findings with Video Visits).   I connected with Anacarolina Evelyn today by a video enabled telemedicine application or telephone and verified that I am speaking with the correct person using two identifiers. Location patient: home Location provider: work  Persons participating in the virtual visit: patient, provider  The limitations, risks, security and privacy concerns of performing an evaluation and management service by telephone and video and the availability of in person appointments have been discussed. It has also been discussed with the patient that there may be a patient responsible charge related to this service. The patient expressed understanding and agreed to proceed.   Reason for visit: work in appt.   HPI: Work in appt to discuss persistent neck pain. 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  She has tried conservative measures including antiinflammatories, muscle relaxer and home exercises and stretches.  Also has been to PT for the past 5-6 weeks.  Very minimal improvement.  Still having dull pain in her neck and shoulders.  Neck pain - starts below her right ear and works down the right side of her neck and then into her shoulder and shoulder blade.  Neck is tight.  Not able to lower her head.  Raising her arms aggravate and send pain into her shoulder and shoulder blade.   No headache.  Breathing stable.  Has noticed rash - neck and chest.  Concern allergy to  ragweed.  Some itching.  Suing 1% hydrocortisone cream.  Taking claritin.  No cough or chest congestion.    ROS: See pertinent positives and negatives per HPI.  Past Medical History:  Diagnosis Date  . Osteoporosis     Past Surgical History:  Procedure Laterality Date  . BREAST BIOPSY    . TONSILLECTOMY AND ADENOIDECTOMY      Family History  Problem Relation Age of Onset  . Heart disease Father   . Colon cancer Maternal Uncle   . Colon cancer Cousin   . Breast cancer Neg Hx     SOCIAL HX: reviewed.    Current Outpatient Medications:  .  Calcium Carbonate-Vit D-Min 1200-1000 MG-UNIT CHEW, Chew 1,200 mg by mouth daily., Disp: , Rfl:  .  melatonin 5 MG TABS, Take by mouth. 5-10 mg nightly, Disp: , Rfl:   EXAM:  GENERAL: alert, oriented, appears well and in no acute distress  HEENT: atraumatic, conjunttiva clear, no obvious abnormalities on inspection of external nose and ears  NECK: normal movements of the head and neck  LUNGS: on inspection no signs of respiratory distress, breathing rate appears normal, no obvious gross SOB, gasping or wheezing  CV: no obvious cyanosis  PSYCH/NEURO: pleasant and cooperative, no obvious depression or anxiety, speech and thought processing grossly intact  ASSESSMENT AND PLAN:  Discussed the following assessment and plan:  Problem List Items Addressed This Visit    Neck pain    Persistent neck, shoulder, shoulder blade and upper arm pain as outlined.  Has tried antiinflammatories,  muscle relaxer and home exercises.  Also has been to physical therapy.  Very minimal improvement.  Persistent pain - limiting rom and mobility.  Discussed further w/up.  Xray as outlined.  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  Needs MRI c-spine for further evaluation.  Order placed.  Further w/up pending results.        Relevant Orders   MR Cervical Spine Wo Contrast   Allergies    Rash as outlined.  Continue antihistamine and cortisone cream.  Follow.  Notify me if persistent.           I discussed the assessment and treatment plan with the patient. The patient was provided an opportunity to ask questions and all were answered. The patient agreed with the plan and demonstrated an understanding of the instructions.   The patient was advised to call back or seek an in-person evaluation if the symptoms worsen or if the condition fails to improve as anticipated.    Dale Owyhee, MD

## 2020-04-12 ENCOUNTER — Encounter: Payer: Self-pay | Admitting: Internal Medicine

## 2020-04-25 ENCOUNTER — Encounter: Payer: Self-pay | Admitting: Internal Medicine

## 2020-04-26 NOTE — Telephone Encounter (Signed)
Pt notified of MRI results.  Discussed [facility] referral.  She wants to monitor symptoms at this time.  Will notify me if has any problems or if desires referral in future.

## 2020-05-18 ENCOUNTER — Encounter: Payer: Self-pay | Admitting: Internal Medicine

## 2020-05-18 DIAGNOSIS — M542 Cervicalgia: Secondary | ICD-10-CM

## 2020-05-20 NOTE — Telephone Encounter (Signed)
Pt scheduled  

## 2020-05-31 NOTE — Telephone Encounter (Signed)
I have placed the order for the neurosurgery referral.  The MRI was done by outside radiology - [facility].  MRI should have been sent to scan, but no copy in records.  [facility] copy from [facility] to forward with her referral to neurosurgery.  Thanks.

## 2020-06-02 ENCOUNTER — Telehealth: Payer: BC Managed Care – PPO | Admitting: Internal Medicine

## 2020-06-30 ENCOUNTER — Encounter: Payer: Self-pay | Admitting: *Deleted

## 2020-07-21 ENCOUNTER — Telehealth: Payer: Self-pay | Admitting: Internal Medicine

## 2020-07-21 DIAGNOSIS — Z13220 Encounter for screening for lipoid disorders: Secondary | ICD-10-CM

## 2020-07-21 DIAGNOSIS — M810 Age-related osteoporosis without current pathological fracture: Secondary | ICD-10-CM

## 2020-07-21 NOTE — Telephone Encounter (Signed)
Patient called in wanted to get labs done need a order for labs

## 2020-07-22 NOTE — Telephone Encounter (Signed)
CPE 3/9. I am happy to get patient schedule if labs ordered?

## 2020-07-22 NOTE — Telephone Encounter (Signed)
Ok.  She can do labs same day as appt.

## 2020-07-22 NOTE — Telephone Encounter (Signed)
Just FYI I called patient & she stated that she thinks front desk misunderstood her. She just needs labs same day as appt due to her living 2 hrs away in [place]. She had just called to make sure she had appointment date correct.

## 2020-07-22 NOTE — Telephone Encounter (Signed)
Order placed for f/u labs.  

## 2020-08-04 ENCOUNTER — Ambulatory Visit (INDEPENDENT_AMBULATORY_CARE_PROVIDER_SITE_OTHER): Payer: BC Managed Care – PPO | Admitting: Internal Medicine

## 2020-08-04 ENCOUNTER — Encounter: Payer: Self-pay | Admitting: Internal Medicine

## 2020-08-04 ENCOUNTER — Other Ambulatory Visit: Payer: Self-pay

## 2020-08-04 VITALS — BP 110/70 | HR 90 | Temp 98.0°F | Resp 16 | Ht 64.0 in | Wt 119.8 lb

## 2020-08-04 DIAGNOSIS — R5383 Other fatigue: Secondary | ICD-10-CM

## 2020-08-04 DIAGNOSIS — Z1211 Encounter for screening for malignant neoplasm of colon: Secondary | ICD-10-CM

## 2020-08-04 DIAGNOSIS — M810 Age-related osteoporosis without current pathological fracture: Secondary | ICD-10-CM

## 2020-08-04 DIAGNOSIS — Z0000 Encounter for general adult medical examination without abnormal findings: Secondary | ICD-10-CM

## 2020-08-04 DIAGNOSIS — M542 Cervicalgia: Secondary | ICD-10-CM

## 2020-08-04 DIAGNOSIS — F439 Reaction to severe stress, unspecified: Secondary | ICD-10-CM

## 2020-08-04 DIAGNOSIS — Z13220 Encounter for screening for lipoid disorders: Secondary | ICD-10-CM

## 2020-08-04 LAB — VITAMIN D 25 HYDROXY (VIT D DEFICIENCY, FRACTURES): VITD: 35.04 ng/mL (ref 30.00–100.00)

## 2020-08-04 LAB — COMPREHENSIVE METABOLIC PANEL
ALT: 14 U/L (ref 0–35)
AST: 22 U/L (ref 0–37)
Albumin: 4.1 g/dL (ref 3.5–5.2)
Alkaline Phosphatase: 84 U/L (ref 39–117)
BUN: 13 mg/dL (ref 6–23)
CO2: 30 mEq/L (ref 19–32)
Calcium: 9.6 mg/dL (ref 8.4–10.5)
Chloride: 102 mEq/L (ref 96–112)
Creatinine, Ser: 0.82 mg/dL (ref 0.40–1.20)
GFR: 78.42 mL/min (ref >60.00)
Glucose, Bld: 86 mg/dL (ref 70–99)
Potassium: 4.3 mEq/L (ref 3.5–5.1)
Sodium: 138 mEq/L (ref 135–145)
Total Bilirubin: 0.5 mg/dL (ref 0.2–1.2)
Total Protein: 6.9 g/dL (ref 6.0–8.3)

## 2020-08-04 LAB — CBC WITH DIFFERENTIAL/PLATELET
Basophils Absolute: 0 10*3/uL (ref 0.0–0.1)
Basophils Relative: 0.6 % (ref 0.0–3.0)
Eosinophils Absolute: 0.1 10*3/uL (ref 0.0–0.7)
Eosinophils Relative: 2.3 % (ref 0.0–5.0)
HCT: 40.8 % (ref 36.0–46.0)
Hemoglobin: 13.8 g/dL (ref 12.0–15.0)
Lymphocytes Relative: 29 % (ref 12.0–46.0)
Lymphs Abs: 1.6 10*3/uL (ref 0.7–4.0)
MCHC: 33.8 g/dL (ref 30.0–36.0)
MCV: 91 fl (ref 78.0–100.0)
Monocytes Absolute: 0.4 10*3/uL (ref 0.1–1.0)
Monocytes Relative: 7 % (ref 3.0–12.0)
Neutro Abs: 3.3 10*3/uL (ref 1.4–7.7)
Neutrophils Relative %: 61.1 % (ref 43.0–77.0)
Platelets: 336 10*3/uL (ref 150.0–400.0)
RBC: 4.49 Mil/uL (ref 3.87–5.11)
RDW: 13.3 % (ref 11.5–15.5)
WBC: 5.5 10*3/uL (ref 4.0–10.5)

## 2020-08-04 LAB — LIPID PANEL
Cholesterol: 206 mg/dL — ABNORMAL HIGH (ref 0–200)
HDL: 70.7 mg/dL (ref >39.00)
LDL Cholesterol: 121 mg/dL — ABNORMAL HIGH (ref 0–99)
NonHDL: 135.44
Total CHOL/HDL Ratio: 3
Triglycerides: 74 mg/dL (ref 0.0–149.0)
VLDL: 14.8 mg/dL (ref 0.0–40.0)

## 2020-08-04 LAB — VITAMIN B12: Vitamin B-12: 500 pg/mL (ref 211–911)

## 2020-08-04 LAB — TSH: TSH: 2.62 u[IU]/mL (ref 0.35–4.50)

## 2020-08-04 NOTE — Progress Notes (Signed)
Patient ID: Whitney Guerrero, female   DOB: 1961/03/30, 60 y.o.   MRN: [id number]   Subjective:    Patient ID: Whitney Guerrero, female    DOB: August 31, 1960, 60 y.o.   MRN: [id number]  HPI This visit occurred during the SARS-CoV-2 public health emergency.  Safety protocols were in place, including screening questions prior to the visit, additional usage of staff PPE, and extensive cleaning of exam room while observing appropriate contact time as indicated for disinfecting solutions.  Patient here for her physical exam.  She is having persistent issues with her neck.  Has tried conservative measures, including PT, antiinflammatories and muscle relaxer.  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  At C5-6 - mild central stenosis and moderate to severe right and severe left foraminal stenosis.  At C6-7 - severe left foraminal stenosis.  This limits her activity.  Saw neurosurgery - discussed surgery.  She is monitoring symptoms currently.  Does try to stay active.  No chest pain or sob reported. No abdominal pain or bowel change reported. Does report decreased energy.  Using melatonin to help with sleep.  Handling stress.    Past Medical History:  Diagnosis Date  . Osteoporosis    Past Surgical History:  Procedure Laterality Date  . BREAST BIOPSY    . TONSILLECTOMY AND ADENOIDECTOMY     Family History  Problem Relation Age of Onset  . Heart disease Father   . Colon cancer Maternal Uncle   . Colon cancer Cousin   . Breast cancer Neg Hx    Social History   Socioeconomic History  . Marital status: Married    Spouse name: Not on file  . Number of children: Not on file  . Years of education: Not on file  . Highest education level: Not on file  Occupational History  . Not on file  Tobacco Use  . Smoking status: Never Smoker  . Smokeless tobacco: Never Used  Substance and Sexual Activity  . Alcohol use: Yes    Alcohol/week: 0.0 standard  drinks    Comment: one glass of wine every other night  . Drug use: No  . Sexual activity: Not on file  Other Topics Concern  . Not on file  Social History Narrative  . Not on file   Social Determinants of Health   Financial Resource Strain: Not on file  Food Insecurity: Not on file  Transportation Needs: Not on file  Physical Activity: Not on file  Stress: Not on file  Social Connections: Not on file    Outpatient Encounter Medications as of 08/04/2020  Medication Sig  . Calcium Carbonate-Vit D-Min 1200-1000 MG-UNIT CHEW Chew 1,200 mg by mouth daily.  . melatonin 5 MG TABS Take by mouth. 5-10 mg nightly   No facility-administered encounter medications on file as of 08/04/2020.    Review of Systems  Constitutional: Negative for appetite change and unexpected weight change.  HENT: Negative for congestion, sinus pressure and sore throat.   Eyes: Negative for pain and visual disturbance.  Respiratory: Negative for cough, chest tightness and shortness of breath.   Cardiovascular: Negative for chest pain, palpitations and leg swelling.  Gastrointestinal: Negative for abdominal pain, diarrhea and vomiting.  Genitourinary: Negative for difficulty urinating and dysuria.  Musculoskeletal: Positive for neck pain. Negative for joint swelling.  Skin: Negative for color change and rash.  Neurological: Negative for dizziness, light-headedness and headaches.  Hematological: Negative for adenopathy. Does  not bruise/bleed easily.  Psychiatric/Behavioral: Negative for agitation and dysphoric mood.       Objective:    Physical Exam Vitals reviewed.  Constitutional:      General: She is not in acute distress.    Appearance: She is well-developed.  HENT:     Head: Normocephalic and atraumatic.     Right Ear: External ear normal.     Left Ear: External ear normal.  Eyes:     General: No scleral icterus.       Right eye: No discharge.        Left eye: No discharge.      Conjunctiva/sclera: Conjunctivae normal.  Neck:     Thyroid: No thyromegaly.  Cardiovascular:     Rate and Rhythm: Normal rate and regular rhythm.  Pulmonary:     Effort: No tachypnea, accessory muscle usage or respiratory distress.     Breath sounds: Normal breath sounds. No decreased breath sounds or wheezing.  Chest:  Breasts:     Right: No inverted nipple, mass, nipple discharge or tenderness (no axillary adenopathy).     Left: No inverted nipple, mass, nipple discharge or tenderness (no axilarry adenopathy).    Abdominal:     General: Bowel sounds are normal.     Palpations: Abdomen is soft.     Tenderness: There is no abdominal tenderness.  Musculoskeletal:        General: No swelling or tenderness.     Cervical back: Neck supple. No tenderness.  Lymphadenopathy:     Cervical: No cervical adenopathy.  Skin:    Findings: No erythema or rash.  Neurological:     Mental Status: She is alert and oriented to person, place, and time.  Psychiatric:        Mood and Affect: Mood normal.        Behavior: Behavior normal.     BP 110/70   Pulse 90   Temp 98 F (36.7 C) (Oral)   Resp 16   Ht 5' 4"  (1.626 m)   Wt 119 lb 12.8 oz (54.3 kg)   SpO2 99%   BMI 20.56 kg/m  Wt Readings from Last 3 Encounters:  08/04/20 119 lb 12.8 oz (54.3 kg)  03/22/20 117 lb (53.1 kg)  07/28/19 116 lb (52.6 kg)     Lab Results  Component Value Date   WBC 5.5 08/04/2020   HGB 13.8 08/04/2020   HCT 40.8 08/04/2020   PLT 336.0 08/04/2020   GLUCOSE 86 08/04/2020   CHOL 206 (H) 08/04/2020   TRIG 74.0 08/04/2020   HDL 70.70 08/04/2020   LDLDIRECT 129.2 04/04/2013   LDLCALC 121 (H) 08/04/2020   ALT 14 08/04/2020   AST 22 08/04/2020   NA 138 08/04/2020   K 4.3 08/04/2020   CL 102 08/04/2020   CREATININE 0.82 08/04/2020   BUN 13 08/04/2020   CO2 30 08/04/2020   TSH 2.62 08/04/2020       Assessment & Plan:   Problem List Items Addressed This Visit    Fatigue    Reports decreased  energy.  Check routine labs, including cbc, met c and tsh.  Also check B12 and vitamin D level.       Relevant Orders   Vitamin B12 (Completed)   Healthcare maintenance    Physical today 08/03/20.  PAP 07/28/19 - negative with negative HPV.  Mammogram 07/28/19 - briads I.  Colonoscopy 11/2012 - redundant colon.  Recommended f/u colonoscopy in 10 years.  Neck pain    Persistent neck, shoulder, shoulder blade and upper arm pain as outlined.  MRI as outlined.  Has tried conservative measures - including antiinflammatories, muscle relaxer, and PT.  Saw neurosurgery.  Following.  Discussed.        Osteoporosis    Continue calcium, vitamin D and weight bearing exercise.  Check vitamin D level.       Stress    Overall appears to be doing relatively well.  Follow.        Other Visit Diagnoses    Routine general medical examination at a health care facility    -  Primary   Colon cancer screening       Relevant Orders   Fecal occult blood, imunochemical   Screening cholesterol level           Einar Pheasant, MD

## 2020-08-04 NOTE — Assessment & Plan Note (Signed)
Physical today 08/03/20.  PAP 07/28/19 - negative with negative HPV.  Mammogram 07/28/19 - briads I.  Colonoscopy 11/2012 - redundant colon.  Recommended f/u colonoscopy in 10 years.

## 2020-08-15 ENCOUNTER — Encounter: Payer: Self-pay | Admitting: Internal Medicine

## 2020-08-15 NOTE — Assessment & Plan Note (Signed)
Overall appears to be doing relatively well.  Follow.   

## 2020-08-15 NOTE — Assessment & Plan Note (Signed)
Continue calcium, vitamin D and weight bearing exercise.  Check vitamin D level.

## 2020-08-15 NOTE — Assessment & Plan Note (Signed)
Persistent neck, shoulder, shoulder blade and upper arm pain as outlined.  MRI as outlined.  Has tried conservative measures - including antiinflammatories, muscle relaxer, and PT.  Saw neurosurgery.  Following.  Discussed.

## 2020-08-15 NOTE — Assessment & Plan Note (Signed)
Reports decreased energy.  Check routine labs, including cbc, met c and tsh.  Also check B12 and vitamin D level.

## 2020-11-25 IMAGING — CR DG CERVICAL SPINE 2 OR 3 VIEWS
1 series · 3 of 3 positions shown · non-contrast
Comparison: None.

CLINICAL DATA: Persistent neck pain. Right arm pain.

EXAM:
CERVICAL SPINE - 2-3 VIEW

[Series 1: dg cervical spine 2 or 3 views · 0.14mm/px · 3 of 3 slices shown]
[im 1/3]
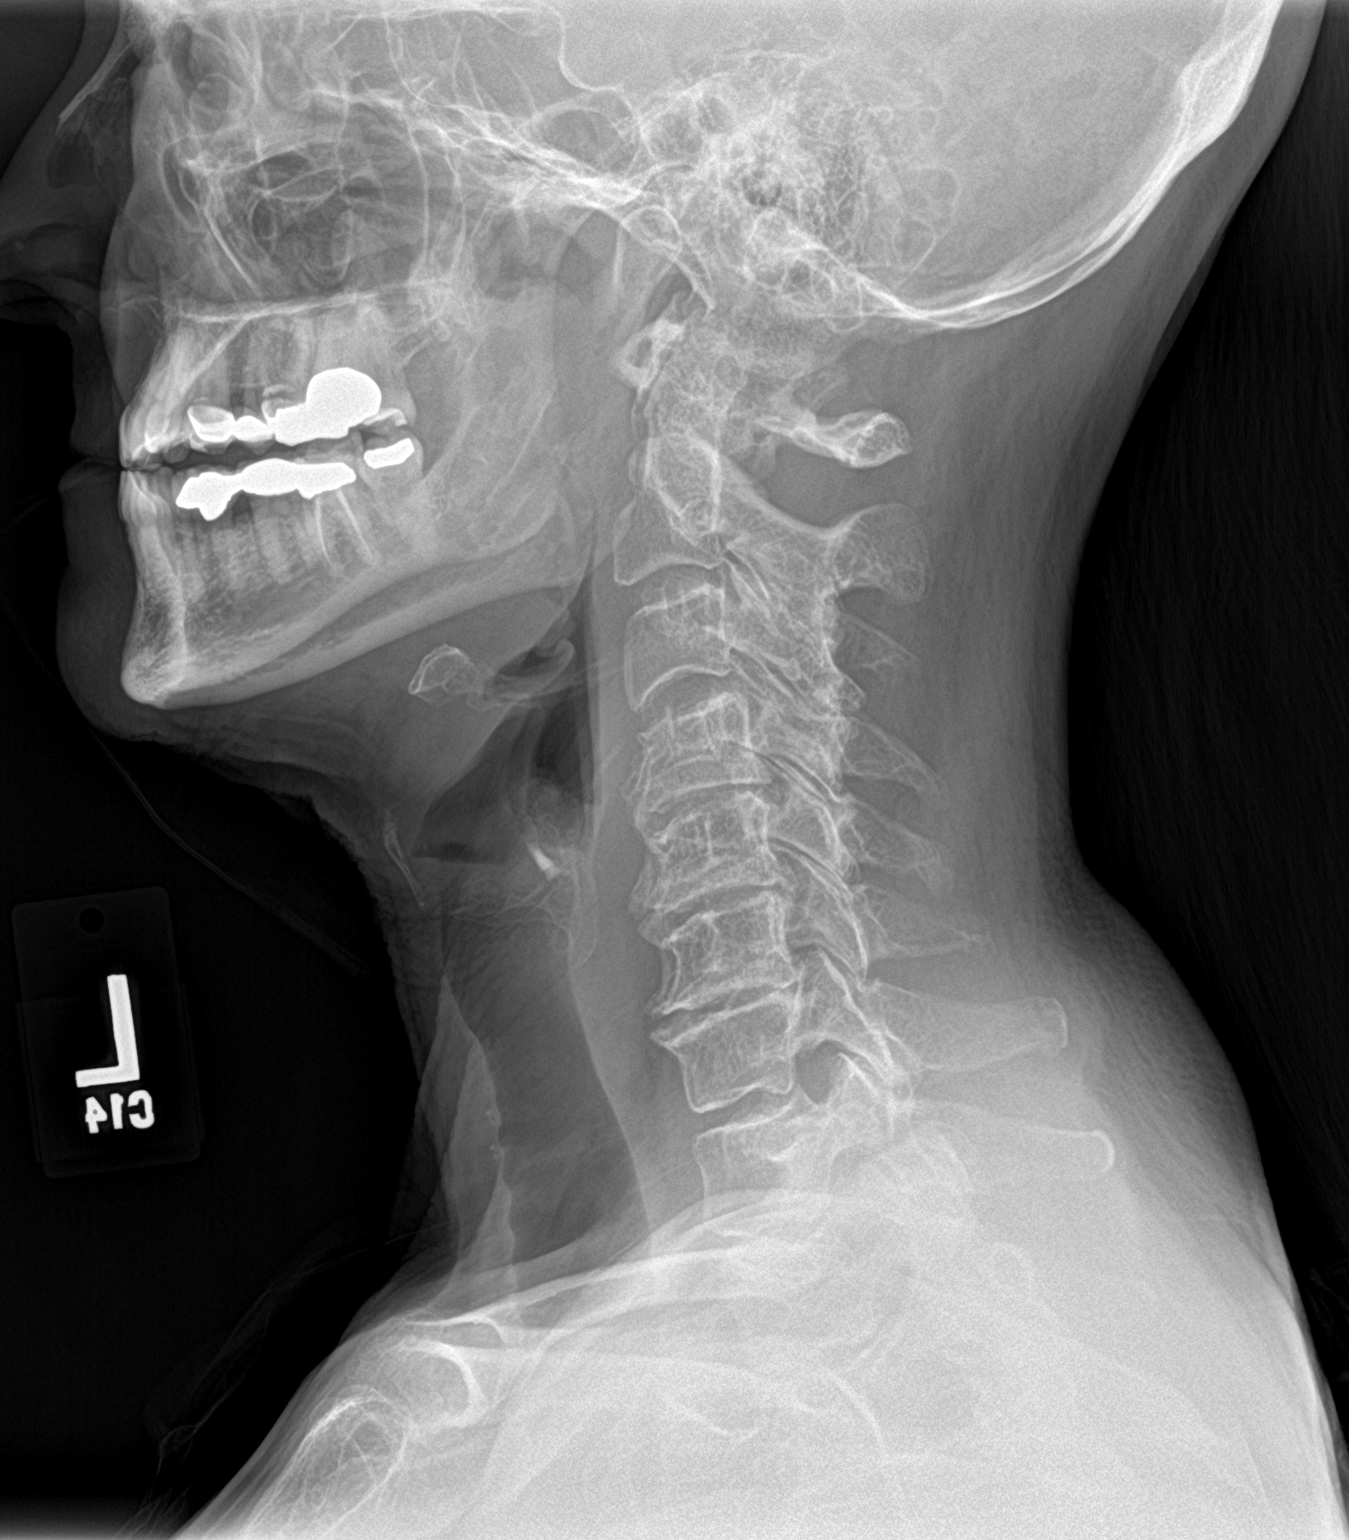
[im 2/3]
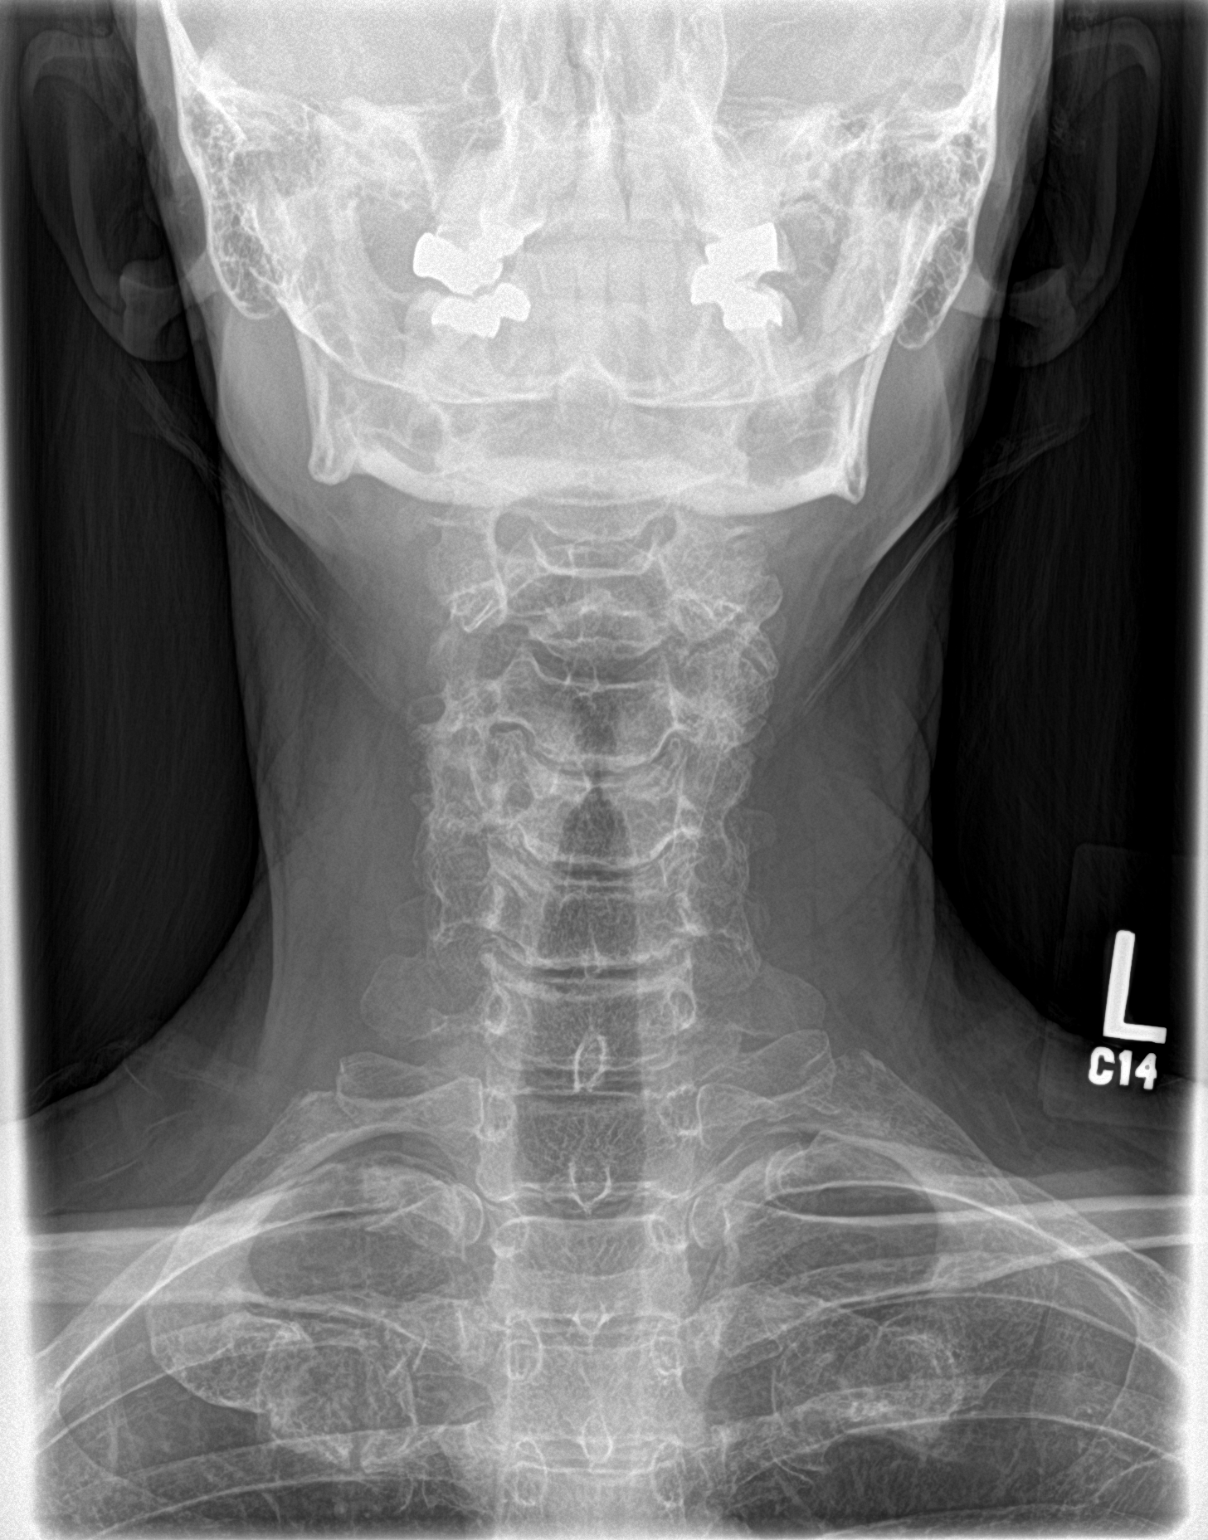
[im 3/3]
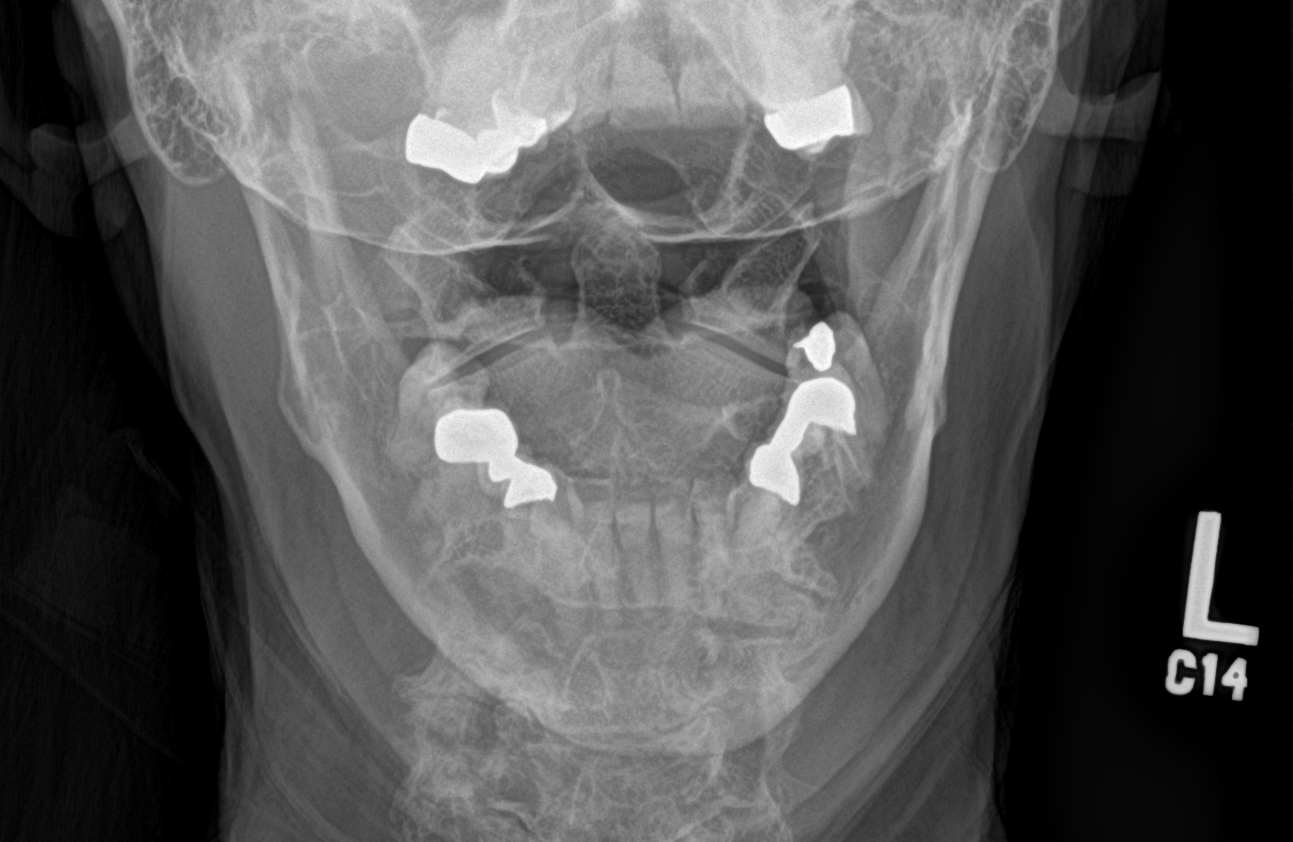

[3 of 3 positions shown; findings below may reference images not displayed]

FINDINGS: Cervical spine is visualized from the skull base through the
cervicothoracic junction. There is no significant listhesis.
Vertebral body heights are maintained. There straightening of the
normal cervical lordosis.

Uncovertebral spurring is most evident at C4-5 and C5-6, right
greater than left. Moderate uncovertebral spurring is present at
C6-7.

No acute or healing fractures are present. Soft tissues are
unremarkable.
IMPRESSION: 1. No acute or healing fracture.
2. Uncovertebral spurring at C4-5 and C5-6, right greater than left.
Likely contributes to foraminal stenosis, corresponding to the
patient's radicular symptoms.
3. No significant listhesis.

## 2021-01-10 ENCOUNTER — Encounter: Payer: Self-pay | Admitting: Internal Medicine

## 2021-01-11 NOTE — Telephone Encounter (Signed)
Left message to call office back

## 2021-01-11 NOTE — Telephone Encounter (Signed)
Please advise, no appointments available in our office this week

## 2021-01-11 NOTE — Telephone Encounter (Signed)
Need to know if she is having any other symptoms.

## 2021-06-03 ENCOUNTER — Telehealth: Payer: Self-pay | Admitting: Internal Medicine

## 2021-06-03 NOTE — Telephone Encounter (Signed)
Pt called in requesting lab appt. Check to see if lab was order. No lab orders. Pt is requesting for lab orders for physical that is schedule on 08/08/2021. Pt requesting callback.

## 2021-06-07 NOTE — Telephone Encounter (Signed)
Patient lives out of town and was requesting to do her labs the same day as her appt so she does not have to drive down twice.

## 2021-08-02 NOTE — Telephone Encounter (Signed)
Pt called in about lab orders not being placed. Pt stated that she requested for lab orders. Pt would like lab order with upcoming physical. Pt requesting callback  ?

## 2021-08-02 NOTE — Telephone Encounter (Signed)
Pt does not want lab orders. She lives in [place]. Wants to have labs done day of cpe. Was concerned about having to fast until 11 because she drinks coffee early morning. Advised that she can drink a cup of coffee early AM and then fast for a few hours prior to her appt. Labs can still be done. ?

## 2021-08-08 ENCOUNTER — Encounter: Payer: BC Managed Care – PPO | Admitting: Internal Medicine

## 2021-08-22 ENCOUNTER — Ambulatory Visit (INDEPENDENT_AMBULATORY_CARE_PROVIDER_SITE_OTHER): Payer: BC Managed Care – PPO | Admitting: Internal Medicine

## 2021-08-22 ENCOUNTER — Other Ambulatory Visit: Payer: Self-pay

## 2021-08-22 ENCOUNTER — Encounter: Payer: Self-pay | Admitting: Internal Medicine

## 2021-08-22 VITALS — BP 128/86 | HR 85 | Temp 98.1°F | Resp 14 | Ht 64.0 in | Wt 118.2 lb

## 2021-08-22 DIAGNOSIS — Z0000 Encounter for general adult medical examination without abnormal findings: Secondary | ICD-10-CM

## 2021-08-22 DIAGNOSIS — M810 Age-related osteoporosis without current pathological fracture: Secondary | ICD-10-CM

## 2021-08-22 DIAGNOSIS — Z13220 Encounter for screening for lipoid disorders: Secondary | ICD-10-CM | POA: Diagnosis not present

## 2021-08-22 DIAGNOSIS — Z1211 Encounter for screening for malignant neoplasm of colon: Secondary | ICD-10-CM

## 2021-08-22 DIAGNOSIS — M542 Cervicalgia: Secondary | ICD-10-CM

## 2021-08-22 DIAGNOSIS — Z1231 Encounter for screening mammogram for malignant neoplasm of breast: Secondary | ICD-10-CM

## 2021-08-22 DIAGNOSIS — F439 Reaction to severe stress, unspecified: Secondary | ICD-10-CM

## 2021-08-22 LAB — COMPREHENSIVE METABOLIC PANEL
ALT: 18 U/L (ref 0–35)
AST: 23 U/L (ref 0–37)
Albumin: 4.4 g/dL (ref 3.5–5.2)
Alkaline Phosphatase: 101 U/L (ref 39–117)
BUN: 16 mg/dL (ref 6–23)
CO2: 30 mEq/L (ref 19–32)
Calcium: 10.5 mg/dL (ref 8.4–10.5)
Chloride: 101 mEq/L (ref 96–112)
Creatinine, Ser: 0.85 mg/dL (ref 0.40–1.20)
GFR: 74.56 mL/min (ref >60.00)
Glucose, Bld: 95 mg/dL (ref 70–99)
Potassium: 4.1 mEq/L (ref 3.5–5.1)
Sodium: 138 mEq/L (ref 135–145)
Total Bilirubin: 0.4 mg/dL (ref 0.2–1.2)
Total Protein: 7 g/dL (ref 6.0–8.3)

## 2021-08-22 LAB — CBC WITH DIFFERENTIAL/PLATELET
Basophils Absolute: 0 10*3/uL (ref 0.0–0.1)
Basophils Relative: 1 % (ref 0.0–3.0)
Eosinophils Absolute: 0.1 10*3/uL (ref 0.0–0.7)
Eosinophils Relative: 2.3 % (ref 0.0–5.0)
HCT: 42.5 % (ref 36.0–46.0)
Hemoglobin: 14.3 g/dL (ref 12.0–15.0)
Lymphocytes Relative: 33.1 % (ref 12.0–46.0)
Lymphs Abs: 1.6 10*3/uL (ref 0.7–4.0)
MCHC: 33.6 g/dL (ref 30.0–36.0)
MCV: 91.2 fl (ref 78.0–100.0)
Monocytes Absolute: 0.4 10*3/uL (ref 0.1–1.0)
Monocytes Relative: 8.3 % (ref 3.0–12.0)
Neutro Abs: 2.6 10*3/uL (ref 1.4–7.7)
Neutrophils Relative %: 55.3 % (ref 43.0–77.0)
Platelets: 350 10*3/uL (ref 150.0–400.0)
RBC: 4.66 Mil/uL (ref 3.87–5.11)
RDW: 13 % (ref 11.5–15.5)
WBC: 4.7 10*3/uL (ref 4.0–10.5)

## 2021-08-22 LAB — LIPID PANEL
Cholesterol: 222 mg/dL — ABNORMAL HIGH (ref 0–200)
HDL: 79.8 mg/dL (ref >39.00)
LDL Cholesterol: 129 mg/dL — ABNORMAL HIGH (ref 0–99)
NonHDL: 142.1
Total CHOL/HDL Ratio: 3
Triglycerides: 64 mg/dL (ref 0.0–149.0)
VLDL: 12.8 mg/dL (ref 0.0–40.0)
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
## 2021-08-23 LAB — TSH: TSH: 2.63 u[IU]/mL (ref 0.35–5.50)

## 2021-08-24 ENCOUNTER — Encounter: Payer: Self-pay | Admitting: Internal Medicine

## 2021-08-24 NOTE — Assessment & Plan Note (Signed)
Has had previous issues with her neck.  Has seen [facility]. Had recommend surgery.  She is doing better.  No significant neck pain.  No arm pain, numbness or tingling. Follow.  ?

## 2021-08-24 NOTE — Assessment & Plan Note (Addendum)
Physical today 08/22/21.  PAP 07/28/19 - negative with negative HPV.  Mammogram 07/28/19 - briads I. Schedule f/u mammogram.  Colonoscopy 11/2012 - redundant colon.  Recommended f/u colonoscopy in 10 years.  IFOB.  ?

## 2021-08-24 NOTE — Assessment & Plan Note (Signed)
Overall appears to be doing relatively well.  Follow.   

## 2021-08-24 NOTE — Assessment & Plan Note (Addendum)
Continue calcium, vitamin D and weight bearing exercise. Last vitamin D level wnl.   ?

## 2021-12-02 LAB — HM MAMMOGRAPHY

## 2022-08-24 ENCOUNTER — Ambulatory Visit (INDEPENDENT_AMBULATORY_CARE_PROVIDER_SITE_OTHER): Payer: BC Managed Care – PPO | Admitting: Internal Medicine

## 2022-08-24 ENCOUNTER — Other Ambulatory Visit (HOSPITAL_COMMUNITY)
Admission: RE | Admit: 2022-08-24 | Discharge: 2022-08-24 | Disposition: A | Discharge status: Home / Self Care | Payer: BC Managed Care – PPO | Source: Ambulatory Visit | Attending: Internal Medicine | Admitting: Internal Medicine

## 2022-08-24 VITALS — BP 108/68 | HR 62 | Temp 97.9°F | Resp 16 | Ht 64.0 in | Wt 117.2 lb

## 2022-08-24 DIAGNOSIS — Z0000 Encounter for general adult medical examination without abnormal findings: Secondary | ICD-10-CM

## 2022-08-24 DIAGNOSIS — M542 Cervicalgia: Secondary | ICD-10-CM

## 2022-08-24 DIAGNOSIS — F439 Reaction to severe stress, unspecified: Secondary | ICD-10-CM

## 2022-08-24 DIAGNOSIS — Z1211 Encounter for screening for malignant neoplasm of colon: Secondary | ICD-10-CM

## 2022-08-24 DIAGNOSIS — M810 Age-related osteoporosis without current pathological fracture: Secondary | ICD-10-CM | POA: Diagnosis not present

## 2022-08-24 DIAGNOSIS — Z124 Encounter for screening for malignant neoplasm of cervix: Secondary | ICD-10-CM

## 2022-08-24 DIAGNOSIS — Z1231 Encounter for screening mammogram for malignant neoplasm of breast: Secondary | ICD-10-CM

## 2022-08-24 DIAGNOSIS — Z13220 Encounter for screening for lipoid disorders: Secondary | ICD-10-CM | POA: Diagnosis not present

## 2022-08-24 LAB — COMPREHENSIVE METABOLIC PANEL
ALT: 17 U/L (ref 0–35)
AST: 24 U/L (ref 0–37)
Albumin: 4.4 g/dL (ref 3.5–5.2)
Alkaline Phosphatase: 91 U/L (ref 39–117)
BUN: 15 mg/dL (ref 6–23)
CO2: 30 mEq/L (ref 19–32)
Calcium: 9.7 mg/dL (ref 8.4–10.5)
Chloride: 103 mEq/L (ref 96–112)
Creatinine, Ser: 0.86 mg/dL (ref 0.40–1.20)
GFR: 73 mL/min (ref >60.00)
Glucose, Bld: 93 mg/dL (ref 70–99)
Potassium: 4.1 mEq/L (ref 3.5–5.1)
Sodium: 139 mEq/L (ref 135–145)
Total Bilirubin: 0.5 mg/dL (ref 0.2–1.2)
Total Protein: 7.1 g/dL (ref 6.0–8.3)

## 2022-08-24 LAB — CBC WITH DIFFERENTIAL/PLATELET
Basophils Absolute: 0 10*3/uL (ref 0.0–0.1)
Basophils Relative: 0.4 % (ref 0.0–3.0)
Eosinophils Absolute: 0.2 10*3/uL (ref 0.0–0.7)
Eosinophils Relative: 2.9 % (ref 0.0–5.0)
HCT: 41.7 % (ref 36.0–46.0)
Hemoglobin: 14.1 g/dL (ref 12.0–15.0)
Lymphocytes Relative: 27.3 % (ref 12.0–46.0)
Lymphs Abs: 1.6 10*3/uL (ref 0.7–4.0)
MCHC: 33.9 g/dL (ref 30.0–36.0)
MCV: 92 fl (ref 78.0–100.0)
Monocytes Absolute: 0.6 10*3/uL (ref 0.1–1.0)
Monocytes Relative: 9.5 % (ref 3.0–12.0)
Neutro Abs: 3.5 10*3/uL (ref 1.4–7.7)
Neutrophils Relative %: 59.9 % (ref 43.0–77.0)
Platelets: 377 10*3/uL (ref 150.0–400.0)
RBC: 4.53 Mil/uL (ref 3.87–5.11)
RDW: 13 % (ref 11.5–15.5)
WBC: 5.9 10*3/uL (ref 4.0–10.5)

## 2022-08-24 LAB — LIPID PANEL
Cholesterol: 239 mg/dL — ABNORMAL HIGH (ref 0–200)
HDL: 72.8 mg/dL (ref >39.00)
LDL Cholesterol: 147 mg/dL — ABNORMAL HIGH (ref 0–99)
NonHDL: 165.74
Total CHOL/HDL Ratio: 3
Triglycerides: 96 mg/dL (ref 0.0–149.0)
VLDL: 19.2 mg/dL (ref 0.0–40.0)

## 2022-08-24 LAB — VITAMIN D 25 HYDROXY (VIT D DEFICIENCY, FRACTURES): VITD: 26.96 ng/mL — ABNORMAL LOW (ref 30.00–100.00)

## 2022-08-24 LAB — TSH: TSH: 3.29 u[IU]/mL (ref 0.35–5.50)

## 2022-08-24 NOTE — Progress Notes (Signed)
Subjective:    Patient ID: Whitney Guerrero, female    DOB: 07-22-1960, 62 y.o.   MRN: [id number]  Patient here for a physical exam.   HPI Here for a physical exam. Has had previous issues with her neck.  Has seen [facility]. Had previously recommended surgery.  She is doing better.  No significant neck pain.  No arm pain, numbness or tingling reported.  Stays active.  No chest pain or sob reported.  No cough or congestion. No abdominal pain reported.  Increased stress.  Discussed.  Seeing a Social worker.  Living here in [place] now.    Past Medical History:  Diagnosis Date   Osteoporosis    Past Surgical History:  Procedure Laterality Date   BREAST BIOPSY     TONSILLECTOMY AND ADENOIDECTOMY     Family History  Problem Relation Age of Onset   Heart disease Father    Colon cancer Maternal Uncle    Colon cancer Cousin    Breast cancer Neg Hx    Social History   Socioeconomic History   Marital status: Married    Spouse name: Not on file   Number of children: Not on file   Years of education: Not on file   Highest education level: Not on file  Occupational History   Not on file  Tobacco Use   Smoking status: Never   Smokeless tobacco: Never  Substance and Sexual Activity   Alcohol use: Yes    Alcohol/week: 0.0 standard drinks of alcohol    Comment: one glass of wine every other night   Drug use: No   Sexual activity: Not on file  Other Topics Concern   Not on file  Social History Narrative   Not on file   Social Determinants of Health   Financial Resource Strain: Not on file  Food Insecurity: Not on file  Transportation Needs: Not on file  Physical Activity: Not on file  Stress: Not on file  Social Connections: Not on file    Review of Systems  Constitutional:  Negative for appetite change and unexpected weight change.  HENT:  Negative for congestion, sinus pressure and sore throat.   Eyes:  Negative for pain and visual disturbance.  Respiratory:  Negative for  cough, chest tightness and shortness of breath.   Cardiovascular:  Negative for chest pain and palpitations.  Gastrointestinal:  Negative for abdominal pain, diarrhea, nausea and vomiting.  Genitourinary:  Negative for difficulty urinating and dysuria.  Musculoskeletal:  Negative for joint swelling and myalgias.  Skin:  Negative for color change and rash.  Neurological:  Negative for dizziness and headaches.  Hematological:  Negative for adenopathy. Does not bruise/bleed easily.  Psychiatric/Behavioral:  Negative for agitation and dysphoric mood.        Objective:     BP 108/68   Pulse 62   Temp 97.9 F (36.6 C)   Resp 16   Ht 5\' 4"  (1.626 m)   Wt 117 lb 3.2 oz (53.2 kg)   SpO2 98%   BMI 20.12 kg/m  Wt Readings from Last 3 Encounters:  08/24/22 117 lb 3.2 oz (53.2 kg)  08/22/21 118 lb 3.2 oz (53.6 kg)  08/04/20 119 lb 12.8 oz (54.3 kg)    Physical Exam Vitals reviewed.  Constitutional:      General: She is not in acute distress.    Appearance: Normal appearance. She is well-developed.  HENT:     Head: Normocephalic and atraumatic.     Right  Ear: External ear normal.     Left Ear: External ear normal.  Eyes:     General: No scleral icterus.       Right eye: No discharge.        Left eye: No discharge.     Conjunctiva/sclera: Conjunctivae normal.  Neck:     Thyroid: No thyromegaly.  Cardiovascular:     Rate and Rhythm: Normal rate and regular rhythm.  Pulmonary:     Effort: No tachypnea, accessory muscle usage or respiratory distress.     Breath sounds: Normal breath sounds. No decreased breath sounds or wheezing.  Chest:  Breasts:    Right: No inverted nipple, mass, nipple discharge or tenderness (no axillary adenopathy).     Left: No inverted nipple, mass, nipple discharge or tenderness (no axilarry adenopathy).  Abdominal:     General: Bowel sounds are normal.     Palpations: Abdomen is soft.     Tenderness: There is no abdominal tenderness.   Genitourinary:    Comments: Normal external genitalia.  Vaginal vault without lesions.  Cervix identified.  Pap smear performed.  Could not appreciate any adnexal masses or tenderness.   Musculoskeletal:        General: No swelling or tenderness.     Cervical back: Neck supple.  Lymphadenopathy:     Cervical: No cervical adenopathy.  Skin:    Findings: No erythema or rash.  Neurological:     Mental Status: She is alert and oriented to person, place, and time.  Psychiatric:        Mood and Affect: Mood normal.        Behavior: Behavior normal.      Outpatient Encounter Medications as of 08/24/2022  Medication Sig   Calcium Carbonate-Vit D-Min 1200-1000 MG-UNIT CHEW Chew 1,200 mg by mouth daily.   melatonin 5 MG TABS Take by mouth. 5-10 mg nightly   No facility-administered encounter medications on file as of 08/24/2022.     Lab Results  Component Value Date   WBC 5.9 08/24/2022   HGB 14.1 08/24/2022   HCT 41.7 08/24/2022   PLT 377.0 08/24/2022   GLUCOSE 93 08/24/2022   CHOL 239 (H) 08/24/2022   TRIG 96.0 08/24/2022   HDL 72.80 08/24/2022   LDLDIRECT 129.2 04/04/2013   LDLCALC 147 (H) 08/24/2022   ALT 17 08/24/2022   AST 24 08/24/2022   NA 139 08/24/2022   K 4.1 08/24/2022   CL 103 08/24/2022   CREATININE 0.86 08/24/2022   BUN 15 08/24/2022   CO2 30 08/24/2022   TSH 3.29 08/24/2022    No results found.     Assessment & Plan:  Routine general medical examination at a health care facility  Healthcare maintenance Assessment & Plan: Physical today 08/24/22.   PAP 07/28/19 - negative with negative HPV.  Repeat pap today.  Mammogram 12/02/21 - briads I. Colonoscopy 11/2012 - redundant colon.  Recommended f/u colonoscopy in 10 years (Dr Gustavo Lah). Referral placed for f/u colonoscopy.     Osteoporosis without current pathological fracture, unspecified osteoporosis type -     CBC with Differential/Platelet -     Comprehensive metabolic panel -     TSH -     VITAMIN D 25  Hydroxy (Vit-D Deficiency, Fractures)  Screening cholesterol level -     Lipid panel  Screening for cervical cancer -     Cytology - PAP  Colon cancer screening -     Ambulatory referral to Gastroenterology  Visit for screening  mammogram -     3D Screening Mammogram, Left and Right; Future  Neck pain Assessment & Plan: Has had previous issues with her neck.  Has seen [facility]. Had recommend surgery. No significant problems reported today.    Stress Assessment & Plan: Increased stress.  Discussed. Overall appears to be doing relatively well.  Seeing a Social worker.  Has good support.  Follow.       Einar Pheasant, MD

## 2022-08-24 NOTE — Assessment & Plan Note (Addendum)
Physical today 08/24/22.   PAP 07/28/19 - negative with negative HPV.  Repeat pap today.  Mammogram 12/02/21 - briads I. Colonoscopy 11/2012 - redundant colon.  Recommended f/u colonoscopy in 10 years (Dr Gustavo Lah). Referral placed for f/u colonoscopy.

## 2022-08-25 LAB — CYTOLOGY - PAP
Comment: NEGATIVE
Diagnosis: NEGATIVE
High risk HPV: NEGATIVE

## 2022-08-26 ENCOUNTER — Encounter: Payer: Self-pay | Admitting: Internal Medicine

## 2022-08-26 NOTE — Assessment & Plan Note (Signed)
Has had previous issues with her neck.  Has seen [facility]. Had recommend surgery. No significant problems reported today.

## 2022-08-26 NOTE — Assessment & Plan Note (Signed)
Increased stress.  Discussed. Overall appears to be doing relatively well.  Seeing a Social worker.  Has good support.  Follow.

## 2022-09-04 ENCOUNTER — Encounter: Payer: Self-pay | Admitting: Internal Medicine

## 2022-12-12 LAB — HM MAMMOGRAPHY

## 2022-12-20 ENCOUNTER — Encounter: Payer: Self-pay | Admitting: Internal Medicine

## 2023-03-21 ENCOUNTER — Encounter: Payer: Self-pay | Admitting: *Deleted

## 2023-03-30 ENCOUNTER — Ambulatory Visit: Payer: BC Managed Care – PPO | Admitting: Anesthesiology

## 2023-03-30 ENCOUNTER — Ambulatory Visit
Admission: RE | Admit: 2023-03-30 | Discharge: 2023-03-30 | Disposition: A | Discharge status: Home / Self Care | Payer: BC Managed Care – PPO | Attending: Gastroenterology | Admitting: Gastroenterology

## 2023-03-30 ENCOUNTER — Encounter: Payer: Self-pay | Admitting: *Deleted

## 2023-03-30 ENCOUNTER — Encounter
Admission: RE | Disposition: A | Discharge status: Home / Self Care | Payer: Self-pay | Source: Home / Self Care | Attending: Gastroenterology

## 2023-03-30 DIAGNOSIS — Q438 Other specified congenital malformations of intestine: Secondary | ICD-10-CM | POA: Diagnosis not present

## 2023-03-30 DIAGNOSIS — K640 First degree hemorrhoids: Secondary | ICD-10-CM | POA: Diagnosis not present

## 2023-03-30 DIAGNOSIS — Z1211 Encounter for screening for malignant neoplasm of colon: Secondary | ICD-10-CM | POA: Diagnosis present

## 2023-03-30 DIAGNOSIS — M810 Age-related osteoporosis without current pathological fracture: Secondary | ICD-10-CM | POA: Diagnosis not present

## 2023-03-30 HISTORY — PX: COLONOSCOPY WITH PROPOFOL: SHX5780

## 2023-03-30 SURGERY — COLONOSCOPY WITH PROPOFOL
Anesthesia: General

## 2023-03-30 MED ORDER — LIDOCAINE HCL (CARDIAC) PF 100 MG/5ML IV SOSY
PREFILLED_SYRINGE | INTRAVENOUS | Status: DC | PRN
  Administered 2023-03-30: 50 mg via INTRAVENOUS

## 2023-03-30 MED ORDER — PROPOFOL 500 MG/50ML IV EMUL
INTRAVENOUS | Status: DC | PRN
  Administered 2023-03-30: 75 ug/kg/min via INTRAVENOUS

## 2023-03-30 MED ORDER — SODIUM CHLORIDE 0.9 % IV SOLN
INTRAVENOUS | Status: DC
  Administered 2023-03-30: 1000 mL via INTRAVENOUS

## 2023-03-30 MED ORDER — PROPOFOL 10 MG/ML IV BOLUS
INTRAVENOUS | Status: DC | PRN
  Administered 2023-03-30: 50 mg via INTRAVENOUS
  Administered 2023-03-30: 20 mg via INTRAVENOUS

## 2023-03-30 MED ORDER — DEXMEDETOMIDINE HCL IN NACL 200 MCG/50ML IV SOLN
INTRAVENOUS | Status: DC | PRN
Start: 2023-03-30 — End: 2023-03-30
  Administered 2023-03-30: 20 ug via INTRAVENOUS

## 2023-03-30 NOTE — Anesthesia Postprocedure Evaluation (Signed)
Anesthesia Post Note  Patient: Whitney Guerrero  Procedure(s) Performed: COLONOSCOPY WITH PROPOFOL  Patient location during evaluation: Endoscopy Anesthesia Type: General Level of consciousness: awake and alert Pain management: pain level controlled Vital Signs Assessment: post-procedure vital signs reviewed and stable Respiratory status: spontaneous breathing, nonlabored ventilation and respiratory function stable Cardiovascular status: blood pressure returned to baseline and stable Postop Assessment: no apparent nausea or vomiting Anesthetic complications: no   No notable events documented.   Last Vitals:  Vitals:   03/30/23 1040 03/30/23 1050  BP: 103/74 120/77  Pulse: 77 76  Resp: 13 16  Temp:    SpO2: 98% 99%    Last Pain:  Vitals:   03/30/23 1050  TempSrc:   PainSc: 0-No pain                 Foye Deer

## 2023-03-30 NOTE — Interval H&P Note (Signed)
History and Physical Interval Note:  03/30/2023 10:06 AM  Whitney Guerrero  has presented today for surgery, with the diagnosis of Z12.11 (ICD-10-CM) - Colon cancer screening.  The various methods of treatment have been discussed with the patient and family. After consideration of risks, benefits and other options for treatment, the patient has consented to  Procedure(s): COLONOSCOPY WITH PROPOFOL (N/A) as a surgical intervention.  The patient's history has been reviewed, patient examined, no change in status, stable for surgery.  I have reviewed the patient's chart and labs.  Questions were answered to the patient's satisfaction.     Regis Bill  Ok to proceed with colonoscopy

## 2023-03-30 NOTE — H&P (Signed)
Outpatient short stay form Pre-procedure 03/30/2023  Regis Bill, MD  Primary Physician: Dale De Soto, MD  Reason for visit:  Screening  History of present illness:    62 y/o lady here for screening colonoscopy. Had normal colonoscopy in 2014. No family history of GI malignancies. No blood thinners. No significant abdominal surgeries.    Current Facility-Administered Medications:    0.9 %  sodium chloride infusion, , Intravenous, Continuous, Lezley Bedgood, Rossie Muskrat, MD, Last Rate: 20 mL/hr at 03/30/23 0938, 1,000 mL at 03/30/23 0938  Medications Prior to Admission  Medication Sig Dispense Refill Last Dose   Calcium Carbonate-Vit D-Min 1200-1000 MG-UNIT CHEW Chew 1,200 mg by mouth daily.   Past Week   folic acid-vitamin b complex-vitamin c-selenium-zinc (DIALYVITE) 3 MG TABS tablet Take 1 tablet by mouth daily.   Past Week   melatonin 5 MG TABS Take by mouth. 5-10 mg nightly   Past Week     No Known Allergies   Past Medical History:  Diagnosis Date   Osteoporosis    Osteoporosis     Review of systems:  Otherwise negative.    Physical Exam  Gen: Alert, oriented. Appears stated age.  HEENT: PERRLA. Lungs: No respiratory distress CV: RRR Abd: soft, benign, no masses Ext: No edema    Planned procedures: Proceed with colonoscopy. The patient understands the nature of the planned procedure, indications, risks, alternatives and potential complications including but not limited to bleeding, infection, perforation, damage to internal organs and possible oversedation/side effects from anesthesia. The patient agrees and gives consent to proceed.  Please refer to procedure notes for findings, recommendations and patient disposition/instructions.     Regis Bill, MD [facility]

## 2023-03-30 NOTE — Anesthesia Preprocedure Evaluation (Addendum)
Anesthesia Evaluation  Patient identified by MRN, date of birth, ID band Patient awake    Reviewed: Allergy & Precautions, H&P , NPO status , Patient's Chart, lab work & pertinent test results  Airway Mallampati: II  TM Distance: >3 FB Neck ROM: full    Dental no notable dental hx.    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 At C5-6, there is mild central stenosis and moderate to severe right and severe left foraminal stenosis. C6-7 there is severe left foraminal stenosis.    Neuromuscular disease  negative psych ROS   GI/Hepatic negative GI ROS, Neg liver ROS,,,  Endo/Other  negative endocrine ROS    Renal/GU negative Renal ROS  negative genitourinary   Musculoskeletal   Abdominal   Peds  Hematology negative hematology ROS (+)   Anesthesia Other Findings Past Medical History: No date: Osteoporosis No date: Osteoporosis  Past Surgical History: No date: BREAST BIOPSY No date: TONSILLECTOMY No date: TONSILLECTOMY AND ADENOIDECTOMY     Reproductive/Obstetrics negative OB ROS                             Anesthesia Physical Anesthesia Plan  ASA: 1  Anesthesia Plan: General   Post-op Pain Management:    Induction: Intravenous  PONV Risk Score and Plan: Propofol infusion and TIVA  Airway Management Planned: Natural Airway  Additional Equipment:   Intra-op Plan:   Post-operative Plan:   Informed Consent: I have reviewed the patients History and Physical, chart, labs and discussed the procedure including the risks, benefits and alternatives for the proposed anesthesia with the patient or authorized representative who has  indicated his/her understanding and acceptance.     Dental Advisory Given  Plan Discussed with: CRNA and Surgeon  Anesthesia Plan Comments:         Anesthesia Quick Evaluation

## 2023-03-30 NOTE — Op Note (Signed)
[facility] Patient Name: Whitney Guerrero Procedure Date: 03/30/2023 9:49 AM MRN: [id number] Account #: [id number] Date of Birth: 02-Sep-1960 Admit Type: Outpatient Age: 62 Room: [facility] ENDO ROOM 3 Gender: Female Note Status: Finalized Instrument Name: Peds Colonoscope [id number] Procedure:             Colonoscopy Indications:           Screening for colorectal malignant neoplasm Providers:             Eather Colas MD, MD Referring MD:          Eather Colas MD, MD (Referring MD) Medicines:             Monitored Anesthesia Care Complications:         No immediate complications. Procedure:             Pre-Anesthesia Assessment:                        - Prior to the procedure, a History and Physical was                         performed, and patient medications and allergies were                         reviewed. The patient is competent. The risks and                         benefits of the procedure and the sedation options and                         risks were discussed with the patient. All questions                         were answered and informed consent was obtained.                         Patient identification and proposed procedure were                         verified by the physician, the nurse, the                         anesthesiologist, the anesthetist and the technician                         in the endoscopy suite. Mental Status Examination:                         alert and oriented. Airway Examination: normal                         oropharyngeal airway and neck mobility. Respiratory                         Examination: clear to auscultation. CV Examination:                         normal. Prophylactic Antibiotics: The patient does not  require prophylactic antibiotics. Prior                         Anticoagulants: The patient has taken no anticoagulant                         or antiplatelet agents. ASA  Grade Assessment: I - A                         normal, healthy patient. After reviewing the risks and                         benefits, the patient was deemed in satisfactory                         condition to undergo the procedure. The anesthesia                         plan was to use monitored anesthesia care (MAC).                         Immediately prior to administration of medications,                         the patient was re-assessed for adequacy to receive                         sedatives. The heart rate, respiratory rate, oxygen                         saturations, blood pressure, adequacy of pulmonary                         ventilation, and response to care were monitored                         throughout the procedure. The physical status of the                         patient was re-assessed after the procedure.                        After obtaining informed consent, the colonoscope was                         passed under direct vision. Throughout the procedure,                         the patient's blood pressure, pulse, and oxygen                         saturations were monitored continuously. The                         Colonoscope was introduced through the anus and                         advanced to the the cecum, identified by appendiceal  orifice and ileocecal valve. The colonoscopy was                         somewhat difficult due to a tortuous colon. The                         patient tolerated the procedure well. The quality of                         the bowel preparation was good. The ileocecal valve,                         appendiceal orifice, and rectum were photographed. Findings:      The perianal and digital rectal examinations were normal.      Internal hemorrhoids were found during retroflexion. The hemorrhoids       were Grade I (internal hemorrhoids that do not prolapse).      The exam was otherwise without  abnormality on direct and retroflexion       views. Impression:            - Internal hemorrhoids.                        - The examination was otherwise normal on direct and                         retroflexion views.                        - No specimens collected. Recommendation:        - Discharge patient to home.                        - Resume previous diet.                        - Continue present medications.                        - Repeat colonoscopy in 10 years for screening                         purposes. Recommend using pediatric colonoscope for                         next procedure.                        - Return to referring physician as previously                         scheduled. Procedure Code(s):     --- Professional ---                        M5784, Colorectal cancer screening; colonoscopy on                         individual not meeting criteria for high risk Diagnosis Code(s):     --- Professional ---  Z12.11, Encounter for screening for malignant neoplasm                         of colon                        K64.0, First degree hemorrhoids CPT copyright 2022 American Medical Association. All rights reserved. The codes documented in this report are preliminary and upon coder review may  be revised to meet current compliance requirements. Eather Colas MD, MD 03/30/2023 10:33:13 AM Number of Addenda: 0 Note Initiated On: 03/30/2023 9:49 AM Scope Withdrawal Time: 0 hours 7 minutes 19 seconds  Total Procedure Duration: 0 hours 15 minutes 39 seconds  Estimated Blood Loss:  Estimated blood loss: none.      [facility]

## 2023-03-30 NOTE — Transfer of Care (Signed)
Immediate Anesthesia Transfer of Care Note  Patient: Whitney Guerrero  Procedure(s) Performed: COLONOSCOPY WITH PROPOFOL  Patient Location: PACU  Anesthesia Type:General  Level of Consciousness: sedated  Airway & Oxygen Therapy: Patient Spontanous Breathing  Post-op Assessment: Report given to RN and Post -op Vital signs reviewed and stable  Post vital signs: Reviewed and stable  Last Vitals:  Vitals Value Taken Time  BP 107/72 03/30/23 1030  Temp 35.8 C 03/30/23 1030  Pulse 77 03/30/23 1030  Resp 14 03/30/23 1030  SpO2 100 % 03/30/23 1030    Last Pain:  Vitals:   03/30/23 1030  TempSrc: Temporal  PainSc: Asleep         Complications: No notable events documented.

## 2023-04-02 ENCOUNTER — Encounter: Payer: Self-pay | Admitting: Gastroenterology

## 2023-08-01 ENCOUNTER — Ambulatory Visit: Payer: 59 | Admitting: Podiatry

## 2023-08-06 ENCOUNTER — Ambulatory Visit (INDEPENDENT_AMBULATORY_CARE_PROVIDER_SITE_OTHER)

## 2023-08-06 ENCOUNTER — Ambulatory Visit: Payer: 59 | Admitting: Podiatry

## 2023-08-06 ENCOUNTER — Encounter: Payer: Self-pay | Admitting: Podiatry

## 2023-08-06 ENCOUNTER — Other Ambulatory Visit: Payer: Self-pay | Admitting: Podiatry

## 2023-08-06 DIAGNOSIS — M7741 Metatarsalgia, right foot: Secondary | ICD-10-CM

## 2023-08-06 DIAGNOSIS — M898X7 Other specified disorders of bone, ankle and foot: Secondary | ICD-10-CM | POA: Diagnosis not present

## 2023-08-06 DIAGNOSIS — D2371 Other benign neoplasm of skin of right lower limb, including hip: Secondary | ICD-10-CM | POA: Diagnosis not present

## 2023-08-06 NOTE — Progress Notes (Signed)
  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 female presents with the above complaint.   ROS: Denies fever chills nausea myelocytes calf pain back pain chest pain shortness breath.  Past Medical History:  Diagnosis Date   Osteoporosis    Osteoporosis    Past Surgical History:  Procedure Laterality Date   BREAST BIOPSY     COLONOSCOPY WITH PROPOFOL N/A 03/30/2023   Procedure: COLONOSCOPY WITH PROPOFOL;  Surgeon: Regis Bill, MD;  Location: [facility] ENDOSCOPY;  Service: Endoscopy;  Laterality: N/A;   TONSILLECTOMY     TONSILLECTOMY AND ADENOIDECTOMY      Current Outpatient Medications:    Calcium Carbonate-Vit D-Min 1200-1000 MG-UNIT CHEW, Chew 1,200 mg by mouth daily., Disp: , Rfl:    folic acid-vitamin b complex-vitamin c-selenium-zinc (DIALYVITE) 3 MG TABS tablet, Take 1 tablet by mouth daily., Disp: , Rfl:    melatonin 5 MG TABS, Take by mouth. 5-10 mg nightly, Disp: , Rfl:    Na Sulfate-K Sulfate-Mg Sulfate concentrate (SUPREP) 17.5-3.13-1.6 GM/177ML SOLN, Take by mouth., Disp: , Rfl:   No Known Allergies Review of Systems Objective:  There were no vitals filed for this visit.  General: Well developed, nourished, in no acute distress, alert and oriented x3   Dermatological: Skin is warm, dry and supple bilateral. Nails x 10 are well maintained; remaining integument appears unremarkable at this time. There are no open sores, no preulcerative lesions, no rash or signs of infection present.  Reactive hyperkeratotic lesion benign medial aspect fifth digit right foot normal  Vascular: Dorsalis Pedis artery and Posterior Tibial artery pedal pulses are 2/4 bilateral with immedate capillary fill time. Pedal hair  growth present. No varicosities and no lower extremity edema present bilateral.   Neruologic: Grossly intact via light touch bilateral. Vibratory intact via tuning fork bilateral. Protective threshold with Semmes Wienstein monofilament intact to all pedal sites bilateral. Patellar and Achilles deep tendon reflexes 2+ bilateral. No Babinski or clonus noted bilateral.   Musculoskeletal: No gross boney pedal deformities bilateral. No pain, crepitus, or limitation noted with foot and ankle range of motion bilateral. Muscular strength 5/5 in all groups tested bilateral.  Osteoarthritis fifth digit right foot DIPJ  Gait: Unassisted, Nonantalgic.    Radiographs:    Today demonstrate osseously mature individual good bone mineralization.  Cerclage wire and 2 Synthes screws onto the fifth metatarsal retained nephritis visualized.  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  Assessment & Plan:   Assessment: Painful callus secondary to medial exostosis fifth digit right foot.  Plan: Debridement benign skin lesion provided her with Soltel having discussed possible exostectomy.     Whitney Guerrero T. [place], [place]

## 2023-08-27 ENCOUNTER — Ambulatory Visit: Payer: BC Managed Care – PPO | Admitting: Internal Medicine

## 2023-08-27 VITALS — BP 108/66 | HR 86 | Temp 98.2°F | Resp 16 | Ht 64.0 in | Wt 122.0 lb

## 2023-08-27 DIAGNOSIS — Z13220 Encounter for screening for lipoid disorders: Secondary | ICD-10-CM

## 2023-08-27 DIAGNOSIS — F439 Reaction to severe stress, unspecified: Secondary | ICD-10-CM

## 2023-08-27 DIAGNOSIS — M810 Age-related osteoporosis without current pathological fracture: Secondary | ICD-10-CM

## 2023-08-27 DIAGNOSIS — M542 Cervicalgia: Secondary | ICD-10-CM

## 2023-08-27 DIAGNOSIS — Z0000 Encounter for general adult medical examination without abnormal findings: Secondary | ICD-10-CM | POA: Diagnosis not present

## 2023-08-27 DIAGNOSIS — M79671 Pain in right foot: Secondary | ICD-10-CM

## 2023-08-27 LAB — CBC WITH DIFFERENTIAL/PLATELET
Basophils Absolute: 0 10*3/uL (ref 0.0–0.1)
Basophils Relative: 0.8 % (ref 0.0–3.0)
Eosinophils Absolute: 0.2 10*3/uL (ref 0.0–0.7)
Eosinophils Relative: 4.3 % (ref 0.0–5.0)
HCT: 40.7 % (ref 36.0–46.0)
Hemoglobin: 13.5 g/dL (ref 12.0–15.0)
Lymphocytes Relative: 30.2 % (ref 12.0–46.0)
Lymphs Abs: 1.4 10*3/uL (ref 0.7–4.0)
MCHC: 33.3 g/dL (ref 30.0–36.0)
MCV: 92.5 fl (ref 78.0–100.0)
Monocytes Absolute: 0.5 10*3/uL (ref 0.1–1.0)
Monocytes Relative: 9.9 % (ref 3.0–12.0)
Neutro Abs: 2.6 10*3/uL (ref 1.4–7.7)
Neutrophils Relative %: 54.8 % (ref 43.0–77.0)
Platelets: 361 10*3/uL (ref 150.0–400.0)
RBC: 4.4 Mil/uL (ref 3.87–5.11)
RDW: 13.1 % (ref 11.5–15.5)
WBC: 4.8 10*3/uL (ref 4.0–10.5)

## 2023-08-27 LAB — COMPREHENSIVE METABOLIC PANEL WITH GFR
ALT: 15 U/L (ref 0–35)
AST: 21 U/L (ref 0–37)
Albumin: 4.1 g/dL (ref 3.5–5.2)
Alkaline Phosphatase: 82 U/L (ref 39–117)
BUN: 18 mg/dL (ref 6–23)
CO2: 30 meq/L (ref 19–32)
Calcium: 9.3 mg/dL (ref 8.4–10.5)
Chloride: 104 meq/L (ref 96–112)
Creatinine, Ser: 0.83 mg/dL (ref 0.40–1.20)
GFR: 75.64 mL/min (ref >60.00)
Glucose, Bld: 89 mg/dL (ref 70–99)
Potassium: 4 meq/L (ref 3.5–5.1)
Sodium: 139 meq/L (ref 135–145)
Total Bilirubin: 0.5 mg/dL (ref 0.2–1.2)
Total Protein: 7.1 g/dL (ref 6.0–8.3)

## 2023-08-27 LAB — LIPID PANEL
Cholesterol: 218 mg/dL — ABNORMAL HIGH (ref 0–200)
HDL: 73.9 mg/dL (ref >39.00)
LDL Cholesterol: 129 mg/dL — ABNORMAL HIGH (ref 0–99)
NonHDL: 143.61
Total CHOL/HDL Ratio: 3
Triglycerides: 75 mg/dL (ref 0.0–149.0)
VLDL: 15 mg/dL (ref 0.0–40.0)

## 2023-08-27 LAB — VITAMIN D 25 HYDROXY (VIT D DEFICIENCY, FRACTURES): VITD: 32.04 ng/mL (ref 30.00–100.00)

## 2023-08-27 LAB — TSH: TSH: 3.91 u[IU]/mL (ref 0.35–5.50)

## 2023-08-27 NOTE — Progress Notes (Signed)
 Subjective:    Patient ID: Whitney Guerrero, female    DOB: [birth year], 63 y.o.   MRN: [id number]  Patient here for  Chief Complaint  Patient presents with   Annual Exam    HPI Here for a physical exam. 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 Watching shoes she wears. No pain with walking. Colonoscopy 03/30/23 - internal hemorrhoids - otherwise normal. Recommended f/u colonoscopy in 10 years. Previous issues with her neck. Has seen [facility]. Had previously recommended surgery. Neck is relatively stable. Increased stress. Discussed. Overall she feels she is handling things relatively well. Stays active. Breathing stable. Bowels are doing better with benefiber. Still with some increased gas. No known trigger. Discussed probiotics. No abdominal pain.    Past Medical History:  Diagnosis Date   Osteoporosis    Osteoporosis    Past Surgical History:  Procedure Laterality Date   BREAST BIOPSY     COLONOSCOPY WITH PROPOFOL N/A 03/30/2023   Procedure: COLONOSCOPY WITH PROPOFOL;  Surgeon: Regis Bill, MD;  Location: [facility] END[facility]COPY;  Service: Endoscopy;  Laterality: N/A;   TONSILLECTOMY     TONSILLECTOMY AND ADENOIDECTOMY     Family History  Problem Relation Age of Onset   Heart disease Father    Colon cancer Maternal Uncle    Colon cancer Cousin    Breast cancer Neg Hx    Social History   Socioeconomic History   Marital status: Married    Spouse name: Not on file   Number of children: Not on file   Years of education: Not on file   Highest education level: Not on file  Occupational History   Not on file  Tobacco Use   Smoking status: Never   Smokeless tobacco: Never  Vaping Use   Vaping status: Never Used  Substance and Sexual Activity   Alcohol use: Yes    Alcohol/week: 0.0 standard drinks of alcohol    Comment: one glass of wine every other night   Drug use: No   Sexual activity: Not  on file  Other Topics Concern   Not on file  Social History Narrative   Not on file   Social Drivers of Health   Financial Resource Strain: Not on file  Food Insecurity: Not on file  Transportation Needs: Not on file  Physical Activity: Not on file  Stress: Not on file  Social Connections: Not on file     Review of Systems  Constitutional:  Negative for appetite change and unexpected weight change.  HENT:  Negative for congestion, sinus pressure and sore throat.   Eyes:  Negative for pain and visual disturbance.  Respiratory:  Negative for cough, chest tightness and shortness of breath.   Cardiovascular:  Negative for chest pain, palpitations and leg swelling.  Gastrointestinal:  Negative for abdominal pain, diarrhea, nausea and vomiting.  Genitourinary:  Negative for difficulty urinating and dysuria.  Musculoskeletal:  Negative for joint swelling and myalgias.  Skin:  Negative for color change and rash.  Neurological:  Negative for dizziness and headaches.  Hematological:  Negative for adenopathy. Does not bruise/bleed easily.  Psychiatric/Behavioral:  Negative for agitation and dysphoric mood.        Objective:     BP 108/66   Pulse 86   Temp 98.2 F (36.8 C)   Resp 16   Ht 5\' 4"  (1.626 m)   Wt 122 lb (55.3 kg)  LMP 09/21/2011   SpO2 99%   BMI 20.94 kg/m  Wt Readings from Last 3 Encounters:  08/27/23 122 lb (55.3 kg)  03/30/23 115 lb 13.3 oz (52.5 kg)  08/24/22 117 lb 3.2 oz (53.2 kg)    Physical Exam Vitals reviewed.  Constitutional:      General: She is not in acute distress.    Appearance: Normal appearance. She is well-developed.  HENT:     Head: Normocephalic and atraumatic.     Right Ear: External ear normal.     Left Ear: External ear normal.     Mouth/Throat:     Pharynx: No oropharyngeal exudate or posterior oropharyngeal erythema.  Eyes:     General: No scleral icterus.       Right eye: No discharge.        Left eye: No discharge.      Conjunctiva/sclera: Conjunctivae normal.  Neck:     Thyroid: No thyromegaly.  Cardiovascular:     Rate and Rhythm: Normal rate and regular rhythm.  Pulmonary:     Effort: No tachypnea, accessory muscle usage or respiratory distress.     Breath sounds: Normal breath sounds. No decreased breath sounds or wheezing.  Chest:  Breasts:    Right: No inverted nipple, mass, nipple discharge or tenderness (no axillary adenopathy).     Left: No inverted nipple, mass, nipple discharge or tenderness (no axilarry adenopathy).  Abdominal:     General: Bowel sounds are normal.     Palpations: Abdomen is soft.     Tenderness: There is no abdominal tenderness.  Musculoskeletal:        General: No swelling or tenderness.     Cervical back: Neck supple.  Lymphadenopathy:     Cervical: No cervical adenopathy.  Skin:    Findings: No erythema or rash.  Neurological:     Mental Status: She is alert and oriented to person, place, and time.  Psychiatric:        Mood and Affect: Mood normal.        Behavior: Behavior normal.         Outpatient Encounter Medications as of 08/27/2023  Medication Sig   Cholecalciferol (VITAMIN D3) 20 MCG (800 UNIT) TABS Take 20 mcg by mouth daily.   Probiotic Product (PHILLIPS COLON HEALTH PO) Take 1 tablet by mouth daily.   Wheat Dextrin (BENEFIBER PO) Take by mouth daily.   Calcium Carbonate-Vit D-Min 1200-1000 MG-UNIT CHEW Chew 1,200 mg by mouth daily.   melatonin 5 MG TABS Take by mouth. 5-10 mg nightly   [DISCONTINUED] folic acid-vitamin b complex-vitamin c-selenium-zinc (DIALYVITE) 3 MG TABS tablet Take 1 tablet by mouth daily.   [DISCONTINUED] Na Sulfate-K Sulfate-Mg Sulfate concentrate (SUPREP) 17.5-3.13-1.6 GM/177ML SOLN Take by mouth.   No facility-administered encounter medications on file as of 08/27/2023.     Lab Results  Component Value Date   WBC 4.8 08/27/2023   HGB 13.5 08/27/2023   HCT 40.7 08/27/2023   PLT 361.0 08/27/2023   GLUCOSE 89  08/27/2023   CHOL 218 (H) 08/27/2023   TRIG 75.0 08/27/2023   HDL 73.90 08/27/2023   LDLDIRECT 129.2 04/04/2013   LDLCALC 129 (H) 08/27/2023   ALT 15 08/27/2023   AST 21 08/27/2023   NA 139 08/27/2023   K 4.0 08/27/2023   CL 104 08/27/2023   CREATININE 0.83 08/27/2023   BUN 18 08/27/2023   CO2 30 08/27/2023   TSH 3.91 08/27/2023       Assessment & Plan:  Routine general medical examination at a health care facility  Osteoporosis without current pathological fracture, unspecified osteoporosis type Assessment & Plan: Continue calcium, vitamin D and weight bearing exercise. Last vitamin D level wnl.  Recheck vitamin D level today.   Orders: -     VITAMIN D 25 Hydroxy (Vit-D Deficiency, Fractures) -     TSH -     Comprehensive metabolic panel with GFR -     CBC with Differential/Platelet  Screening cholesterol level -     Lipid panel  Healthcare maintenance Assessment & Plan: Physical today 08/27/23.   PAP 08/24/22 - negative with negative HPV. Atrophy.  Mammogram 12/12/22 - briads I. Colonoscopy 03/30/23- internal hemorrhoids otherwise normal. Recommended f/u colonoscopy in 10 years (Dr Mia Creek).    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 Watching shoes she wears. No pain with walking.    Stress Assessment & Plan: Increased stress.  Discussed. Overall appears to be doing relatively well.  Has good support. Follow. Notify me if feels needs further intervention.    Neck pain Assessment & Plan: Previous issues with her neck. Has seen [facility]. Had previously recommended surgery. Neck is relatively stable.      Dale Otway, MD

## 2023-08-27 NOTE — Assessment & Plan Note (Addendum)
 Physical today 08/27/23.   PAP 08/24/22 - negative with negative HPV. Atrophy.  Mammogram 12/12/22 - briads I. Colonoscopy 03/30/23- internal hemorrhoids otherwise normal. Recommended f/u colonoscopy in 10 years (Dr Mia Creek).

## 2023-08-28 ENCOUNTER — Encounter: Payer: Self-pay | Admitting: Internal Medicine

## 2023-09-02 ENCOUNTER — Encounter: Payer: Self-pay | Admitting: Internal Medicine

## 2023-09-02 NOTE — Assessment & Plan Note (Signed)
 Increased stress.  Discussed. Overall appears to be doing relatively well.  Has good support. Follow. Notify me if feels needs further intervention.

## 2023-09-02 NOTE — Assessment & Plan Note (Signed)
 Previous issues with her neck. Has seen [facility]. Had previously recommended surgery. Neck is relatively stable.

## 2023-09-02 NOTE — Assessment & Plan Note (Addendum)
 Continue calcium, vitamin D and weight bearing exercise. Last vitamin D level wnl.  Recheck vitamin D level today.

## 2023-09-02 NOTE — Assessment & Plan Note (Signed)
 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 Watching shoes she wears. No pain with walking.

## 2023-10-23 ENCOUNTER — Encounter: Payer: Self-pay | Admitting: Internal Medicine

## 2023-10-24 MED ORDER — VALACYCLOVIR HCL 500 MG PO TABS: 500.0000 mg | ORAL_TABLET | Freq: Every day | ORAL | 6 refills | Status: AC

## 2023-10-24 NOTE — Telephone Encounter (Signed)
 I have pended rx for you to sign

## 2023-10-24 NOTE — Telephone Encounter (Signed)
 Rx ok'd for valcyclovir

## 2023-12-25 ENCOUNTER — Encounter: Payer: Self-pay | Admitting: Internal Medicine

## 2023-12-25 NOTE — Telephone Encounter (Signed)
 Called patient and scheduled for acute appt for neck pain. Has been going on for about a month. Worse on the right side. Unaware of any injury.

## 2023-12-26 ENCOUNTER — Ambulatory Visit: Admitting: Internal Medicine

## 2023-12-26 ENCOUNTER — Encounter: Payer: Self-pay | Admitting: Internal Medicine

## 2023-12-26 VITALS — BP 118/78 | HR 78 | Temp 98.0°F | Ht 64.0 in | Wt 117.6 lb

## 2023-12-26 DIAGNOSIS — M542 Cervicalgia: Secondary | ICD-10-CM | POA: Diagnosis not present

## 2023-12-26 MED ORDER — METHOCARBAMOL 500 MG PO TABS: 500.0000 mg | ORAL_TABLET | Freq: Three times a day (TID) | ORAL | 0 refills | Status: AC | PRN

## 2023-12-26 NOTE — Patient Instructions (Addendum)
-   It was a pleasure meeting you today -I suspect that the pain that you have in your neck is likely musculoskeletal in origin especially given that the pain is worse with movement - I do not see any signs of an ear infection currently -I have prescribed a muscle relaxer (Robaxin ) to be taken every 8 hours as needed for muscle spasms to see if this will help with the pain -If the pain persists despite the muscle relaxer and conservative measures like heating pads and Tiger balm please contact us  and we will refer you to physical therapy for further evaluation -Please contact us  with any questions or concerns or if your symptoms worsen despite treatment

## 2023-12-26 NOTE — Progress Notes (Signed)
 Acute Office Visit  Subjective:     Patient ID: Whitney Guerrero, female    DOB: [birth year], 63 y.o.   MRN: [id number]  Chief Complaint  Patient presents with   Acute Visit    Neck pain on right side x 1 month    HPI Patient is in today for for right-sided neck pain.  Patient states that she has a history of neck pain but this pain is different.  Her usual pain radiates down her arms but this pain is present mainly over the right side of her neck.  Patient states it started after she was doing some work in her garden and heard a pop over the right side of her neck.  Patient states the pain has been persistent over the last 3 to 4 weeks.  She has used heating pads and Tiger balm intermittently with some relief.  No fevers or chills.  No sore throat.  No ear pain.  No ear discharge.  No recent URI.  Pain is worse when she turns her head to the left.  She also states she feels a small swelling behind her right ear.  She denies any other complaints at this time.   Review of Systems  Constitutional: Negative.  Negative for chills, fever and malaise/fatigue.  HENT: Negative.  Negative for congestion, ear discharge, ear pain, sinus pain and sore throat.   Respiratory: Negative.  Negative for cough.   Cardiovascular: Negative.   Gastrointestinal: Negative.   Musculoskeletal:  Positive for neck pain.  Neurological: Negative.  Negative for sensory change and focal weakness.  Psychiatric/Behavioral: Negative.          Objective:    BP 118/78   Pulse 78   Temp 98 F (36.7 C)   Ht 5' 4 (1.626 m)   Wt 117 lb 9.6 oz (53.3 kg)   LMP 09/21/2011   SpO2 97%   BMI 20.19 kg/m    Physical Exam Constitutional:      Appearance: Normal appearance.  HENT:     Head: Normocephalic and atraumatic.     Right Ear: Tympanic membrane, ear canal and external ear normal.     Left Ear: Tympanic membrane, ear canal and external ear normal.     Mouth/Throat:     Pharynx: Oropharynx is clear. No  oropharyngeal exudate or posterior oropharyngeal erythema.  Cardiovascular:     Rate and Rhythm: Normal rate and regular rhythm.     Heart sounds: Normal heart sounds.  Pulmonary:     Breath sounds: Normal breath sounds. No wheezing or rales.  Musculoskeletal:     Cervical back: Neck supple.  Lymphadenopathy:     Cervical: No cervical adenopathy.  Neurological:     Mental Status: She is alert.     Comments: Upper extremity strength is 5 out of 5 bilaterally.  Upper extremity sensation is intact.    Psychiatric:        Mood and Affect: Mood normal.        Behavior: Behavior normal.     No results found for any visits on 12/26/23.      Assessment & Plan:   Problem List Items Addressed This Visit       Other   Neck pain - Primary   - Patient complains of right-sided neck pain for the last 3 to 4 weeks.  Pain is different from her chronic neck pain which usually shoots down her arms -Pain remains over the right side of her neck  and is worse when turning her head to the left. -It started when she was out gardening and heard a pop over the right side of her neck -On exam, no erythema or tenderness noted to palpation.  Upper extremity strength and sensation intact -I suspect her pain is musculoskeletal in origin -Patient has been trying conservative measures including heating pads and Tiger balm without much relief -Will prescribe Robaxin  as needed for muscle spasm and encouraged her to continue with conservative measures for now -If pain persists will consider referral to physical therapy for further evaluation -Return precautions given to the patient      Relevant Medications   methocarbamol  (ROBAXIN ) 500 MG tablet    Meds ordered this encounter  Medications   methocarbamol  (ROBAXIN ) 500 MG tablet    Sig: Take 1 tablet (500 mg total) by mouth every 8 (eight) hours as needed for muscle spasms.    Dispense:  30 tablet    Refill:  0    No follow-ups on file.  Yomar Mejorado, MD

## 2023-12-26 NOTE — Assessment & Plan Note (Signed)
-   Patient complains of right-sided neck pain for the last 3 to 4 weeks.  Pain is different from her chronic neck pain which usually shoots down her arms -Pain remains over the right side of her neck and is worse when turning her head to the left. -It started when she was out gardening and heard a pop over the right side of her neck -On exam, no erythema or tenderness noted to palpation.  Upper extremity strength and sensation intact -I suspect her pain is musculoskeletal in origin -Patient has been trying conservative measures including heating pads and Tiger balm without much relief -Will prescribe Robaxin  as needed for muscle spasm and encouraged her to continue with conservative measures for now -If pain persists will consider referral to physical therapy for further evaluation -Return precautions given to the patient

## 2024-03-28 ENCOUNTER — Encounter: Payer: Self-pay | Admitting: Internal Medicine

## 2024-03-28 DIAGNOSIS — Z1231 Encounter for screening mammogram for malignant neoplasm of breast: Secondary | ICD-10-CM

## 2024-03-28 NOTE — Telephone Encounter (Signed)
Mammo pended.

## 2024-03-30 NOTE — Telephone Encounter (Signed)
 Order signed for mammogram. My chart message sent to pt.

## 2024-05-14 ENCOUNTER — Ambulatory Visit: Admission: RE | Admit: 2024-05-14 | Discharge: 2024-05-14 | Attending: Internal Medicine | Admitting: Internal Medicine

## 2024-05-14 DIAGNOSIS — Z1231 Encounter for screening mammogram for malignant neoplasm of breast: Secondary | ICD-10-CM | POA: Diagnosis present

## 2024-05-20 ENCOUNTER — Inpatient Hospital Stay
Admission: RE | Admit: 2024-05-20 | Discharge: 2024-05-20 | Disposition: A | Discharge status: Home / Self Care | Payer: Self-pay | Source: Ambulatory Visit | Attending: Internal Medicine | Admitting: Internal Medicine

## 2024-05-20 ENCOUNTER — Other Ambulatory Visit: Payer: Self-pay | Admitting: *Deleted

## 2024-05-20 DIAGNOSIS — Z1231 Encounter for screening mammogram for malignant neoplasm of breast: Secondary | ICD-10-CM

## 2024-08-28 ENCOUNTER — Encounter: Admitting: Internal Medicine
